# Patient Record
Sex: Male | Born: 1965 | Race: White | Hispanic: No | Marital: Single | Smoking: Former smoker
Health system: Southern US, Community
[De-identification: ages and names within clinical notes are randomized; demographics above are authoritative.]

## PROBLEM LIST (undated history)

## (undated) DIAGNOSIS — N200 Calculus of kidney: Secondary | ICD-10-CM

## (undated) DIAGNOSIS — N529 Male erectile dysfunction, unspecified: Secondary | ICD-10-CM

## (undated) DIAGNOSIS — K219 Gastro-esophageal reflux disease without esophagitis: Secondary | ICD-10-CM

## (undated) HISTORY — PX: WISDOM TOOTH EXTRACTION: SHX21

## (undated) HISTORY — DX: Male erectile dysfunction, unspecified: N52.9

## (undated) HISTORY — DX: Calculus of kidney: N20.0

## (undated) HISTORY — DX: Gastro-esophageal reflux disease without esophagitis: K21.9

---

## 1993-03-02 HISTORY — PX: UROGENITAL SINUS REPAIR: SHX2628

## 2004-09-08 ENCOUNTER — Ambulatory Visit: Payer: Self-pay | Admitting: Internal Medicine

## 2007-03-18 ENCOUNTER — Ambulatory Visit: Payer: Self-pay | Admitting: Internal Medicine

## 2007-03-18 DIAGNOSIS — K219 Gastro-esophageal reflux disease without esophagitis: Secondary | ICD-10-CM | POA: Insufficient documentation

## 2007-03-18 DIAGNOSIS — N529 Male erectile dysfunction, unspecified: Secondary | ICD-10-CM | POA: Insufficient documentation

## 2007-03-21 LAB — CONVERTED CEMR LAB
Cholesterol: 180 mg/dL (ref 0–200)
HDL: 30.2 mg/dL — ABNORMAL LOW (ref >39.0)
Total CHOL/HDL Ratio: 6
Triglycerides: 287 mg/dL (ref 0–149)

## 2008-08-17 ENCOUNTER — Ambulatory Visit: Payer: Self-pay | Admitting: Internal Medicine

## 2008-09-28 ENCOUNTER — Ambulatory Visit: Payer: Self-pay | Admitting: Internal Medicine

## 2008-09-28 DIAGNOSIS — R5381 Other malaise: Secondary | ICD-10-CM | POA: Insufficient documentation

## 2008-09-28 DIAGNOSIS — R5383 Other fatigue: Secondary | ICD-10-CM

## 2008-10-01 LAB — CONVERTED CEMR LAB
AST: 11 units/L (ref 0–37)
Alkaline Phosphatase: 80 units/L (ref 39–117)
BUN: 15 mg/dL (ref 6–23)
Basophils Absolute: 0 10*3/uL (ref 0.0–0.1)
Calcium: 9.8 mg/dL (ref 8.4–10.5)
Creatinine, Ser: 0.96 mg/dL (ref 0.40–1.50)
Eosinophils Absolute: 0.2 10*3/uL (ref 0.0–0.7)
Eosinophils Relative: 2 % (ref 0–5)
Glucose, Bld: 105 mg/dL — ABNORMAL HIGH (ref 70–99)
HCT: 43.9 % (ref 39.0–52.0)
MCHC: 33 g/dL (ref 30.0–36.0)
MCV: 90 fL (ref 78.0–100.0)
Platelets: 246 10*3/uL (ref 150–400)
RDW: 13.4 % (ref 11.5–15.5)

## 2010-01-03 ENCOUNTER — Ambulatory Visit: Payer: Self-pay | Admitting: Internal Medicine

## 2010-04-01 NOTE — Assessment & Plan Note (Signed)
Summary: TROUBLE SWALLOWING FOOD / LFW   Vital Signs:  Patient profile:   45 year old male Weight:      180 pounds BMI:     25.20 Temp:     98.5 degrees F oral BP sitting:   122 / 80  (left arm) Cuff size:   large  Vitals Entered By: Mervin Hack CMA Duncan Dull) (January 03, 2010 9:14 AM) CC: heartburn   History of Present Illness: Havinbg trouble swallowing Feels his food get stuck at the bottom of his food pipe Has to wait for it to pass Long standing but worse now  uses tums regularly--this does help the burn No nocturnal symptoms  No sig regurgitation No cough No throat symptoms or change in voice  Allergies: No Known Drug Allergies  Past History:  Past medical, surgical, family and social histories (including risk factors) reviewed for relevance to current acute and chronic problems.  Past Medical History: Reviewed history from 09/28/2008 and no changes required. GERD Erectile dysfunction  Past Surgical History: Reviewed history from 03/08/2007 and no changes required. Kidney stone 2005 Umbilicus sinus infection - repair  Family History: Reviewed history from 03/08/2007 and no changes required. Father may have  HTN Mother is healthy  One sister No CAD or DM No prostate or colon cancer Asthma (mild) in son  Social History: Reviewed history from 03/08/2007 and no changes required. Marital Status: Single Children: One son, lives with mother Occupation: Public librarian truck- Physicist, medical Former Smoker---quit  ~2002 Alcohol use-occ  Review of Systems       appetite is fine weight stable  bowels are fine  Physical Exam  General:  alert and healthy-appearing.   Neck:  supple, no masses, no thyromegaly, and no cervical lymphadenopathy.   Lungs:  normal respiratory effort, no intercostal retractions, no accessory muscle use, and normal breath sounds.   Heart:  normal rate, regular rhythm, no murmur, and no gallop.   Abdomen:  soft, non-tender, and no  masses.   Extremities:  no edema Psych:  normally interactive, good eye contact, not anxious appearing, and not depressed appearing.     Impression & Recommendations:  Problem # 1:  GERD (ICD-530.81) Assessment Deteriorated  now with dysphagia explained that tums is not helping his problem  P: omeprazole 20mg  daily for at least the next 6 weeks    GI consult if persists    can try off meds if symptoms resolved  His updated medication list for this problem includes:    Omeprazole 20 Mg Tbec (Omeprazole) .Marland Kitchen... 1 tab daily to treat acid. take on an empty stomach  Complete Medication List: 1)  Multivitamins Tabs (Multiple vitamin) .... Take one by mouth daily 2)  Vitamin B-12 1000 Mcg Tabs (Cyanocobalamin) .Marland Kitchen.. 1 daily by mouth 3)  Vitamin C 1000 Mg Tabs (Ascorbic acid) .Marland Kitchen.. 1 daily by mouth 4)  Vardenafil Hcl 10 Mg Tabs (Vardenafil hcl) .Marland Kitchen.. 1 tab about 30-60 minutes before sex as needed 5)  Omeprazole 20 Mg Tbec (Omeprazole) .Marland Kitchen.. 1 tab daily to treat acid. take on an empty stomach  Patient Instructions: 1)  Please take the omeprazole 20mg  daily for at least the next 4-6 weeks. If your symptoms are gone then, you can try to use it only as needed  2)  Please call if your swallowing is not normal within the next month 3)  Please schedule a follow-up appointment in 6-9 months for a physical Prescriptions: VARDENAFIL HCL 10 MG TABS (VARDENAFIL HCL) 1 tab  about 30-60 minutes before sex as needed  #10 x 3   Entered and Authorized by:   Cindee Salt MD   Signed by:   Cindee Salt MD on 01/03/2010   Method used:   Electronically to        [facility] (retail)       [address], [address]       [place], [place]  [address]       Ph: [phone]       Fax: [phone]   RxID:   [id number]    Orders Added: 1)  Est. Patient Level III [42595]   Immunization History:  Influenza Immunization History:    Influenza:   historical (11/30/2009)   Immunization History:  Influenza Immunization History:    Influenza:  Historical (11/30/2009)  Current Allergies (reviewed today): No known allergies

## 2010-08-02 ENCOUNTER — Emergency Department: Payer: Self-pay | Admitting: Unknown Physician Specialty

## 2010-08-05 ENCOUNTER — Encounter: Payer: Self-pay | Admitting: Internal Medicine

## 2010-08-06 ENCOUNTER — Ambulatory Visit: Payer: Self-pay | Admitting: Internal Medicine

## 2010-10-06 ENCOUNTER — Encounter: Payer: Self-pay | Admitting: Internal Medicine

## 2010-10-06 ENCOUNTER — Ambulatory Visit (INDEPENDENT_AMBULATORY_CARE_PROVIDER_SITE_OTHER): Payer: BC Managed Care – PPO | Admitting: Internal Medicine

## 2010-10-06 VITALS — BP 138/90 | HR 72 | Temp 98.5°F | Wt 180.0 lb

## 2010-10-06 DIAGNOSIS — Z0000 Encounter for general adult medical examination without abnormal findings: Secondary | ICD-10-CM

## 2010-10-06 DIAGNOSIS — R5383 Other fatigue: Secondary | ICD-10-CM

## 2010-10-06 DIAGNOSIS — K219 Gastro-esophageal reflux disease without esophagitis: Secondary | ICD-10-CM

## 2010-10-06 DIAGNOSIS — R5381 Other malaise: Secondary | ICD-10-CM

## 2010-10-06 NOTE — Progress Notes (Signed)
Subjective:    Patient ID: Colin Price, male    DOB: 1965-10-11, 45 y.o.   MRN: [id number]  HPI Doing fine No longer having the dysphagia Takes the omeprazole about every other day No sig water brash or regular heartburn  No changes in work or FH  Current Outpatient Prescriptions on File Prior to Visit  Medication Sig Dispense Refill  . Ascorbic Acid (VITAMIN C) 1000 MG tablet Take 1,000 mg by mouth daily.        . Multiple Vitamin (MULTIVITAMIN) capsule Take 1 capsule by mouth daily.        Marland Kitchen omeprazole (PRILOSEC) 20 MG capsule Take 20 mg by mouth daily.        . vitamin B-12 (CYANOCOBALAMIN) 1000 MCG tablet Take 1,000 mcg by mouth daily.        . vardenafil (LEVITRA) 10 MG tablet Take 10 mg by mouth daily as needed.          No Known Allergies  Past Medical History  Diagnosis Date  . GERD (gastroesophageal reflux disease)   . ED (erectile dysfunction)     Past Surgical History  Procedure Date  . Kidney stone surgery 2005  . Urogenital sinus repair     umbilicus sinus infection- repair    Family History  Problem Relation Age of Onset  . Healthy Mother   . Hypertension Father   . Cancer Neg Hx   . Diabetes Neg Hx     History   Social History  . Marital Status: Married    Spouse Name: N/A    Number of Children: 1  . Years of Education: N/A   Occupational History  . drives truck- Primary school teacher    Social History Main Topics  . Smoking status: Former Games developer  . Smokeless tobacco: Never Used  . Alcohol Use: Yes     occasional  . Drug Use: No  . Sexually Active: Not on file   Other Topics Concern  . Not on file   Social History Narrative  . No narrative on file   Review of Systems  Constitutional: Positive for fatigue. Negative for unexpected weight change.       Wears seat belt Occ gets drowsy periods in afternoon--just waits it out and he gets his second wind. He has to get up at 4AM  HENT: Negative for hearing loss, congestion, rhinorrhea,  dental problem and tinnitus.        Regular with dentist  Eyes: Negative for visual disturbance.       No diplopia or focal vision loss  Respiratory: Negative for cough, chest tightness and shortness of breath.   Cardiovascular: Negative for chest pain, palpitations and leg swelling.  Gastrointestinal: Negative for nausea, vomiting, constipation and blood in stool.       Heartburn controlled Rare blood on toilet paper---last was long ago  Genitourinary: Positive for flank pain. Negative for dysuria, frequency and difficulty urinating.       Did have a kidney stone about 2 months ago--seen in urgent care and then ER Not sure if he passed it though but symptoms are gone Hasn't needed meds for ED  Musculoskeletal: Positive for arthralgias. Negative for back pain and joint swelling.       Occ wrist pain AM stiffness in ankles that clears quickly  Skin: Negative for rash.       No suspicious areas but has spot on upper right chest--doesn't bother him  Neurological: Negative for dizziness, syncope, weakness, light-headedness and  headaches.  Hematological: Negative for adenopathy. Does not bruise/bleed easily.  Psychiatric/Behavioral: Negative for sleep disturbance and dysphoric mood. The patient is not nervous/anxious.        Sleeps well but not always enough       Objective:   Physical Exam  Constitutional: He is oriented to person, place, and time. He appears well-developed and well-nourished. No distress.  HENT:  Head: Normocephalic and atraumatic.  Right Ear: External ear normal.  Left Ear: External ear normal.  Mouth/Throat: Oropharynx is clear and moist. No oropharyngeal exudate.       Left TM normal Right obscurred with cerumen  Eyes: Conjunctivae and EOM are normal. Pupils are equal, round, and reactive to light.       Fundi benign  Neck: Normal range of motion. Neck supple. No thyromegaly present.  Cardiovascular: Normal rate, regular rhythm, normal heart sounds and intact  distal pulses.  Exam reveals no gallop.   No murmur heard. Pulmonary/Chest: Effort normal and breath sounds normal. No respiratory distress. He has no wheezes. He has no rales.  Abdominal: Soft. There is no tenderness.  Musculoskeletal: Normal range of motion. He exhibits no edema and no tenderness.  Lymphadenopathy:    He has no cervical adenopathy.  Neurological: He is alert and oriented to person, place, and time. He exhibits normal muscle tone.       Normal strength and gait  Skin: No rash noted.       Small warty growth on right upper chest (2-21mm)  Psychiatric: He has a normal mood and affect. His behavior is normal. Judgment and thought content normal.          Assessment & Plan:

## 2010-10-06 NOTE — Assessment & Plan Note (Signed)
Tired in afternoon at times Doesn't seem to be sleep apnea--may need to get to bed earlier

## 2010-10-06 NOTE — Assessment & Plan Note (Signed)
Better now No dysphagia  On omeprazole about 3-4 days per week

## 2010-10-06 NOTE — Assessment & Plan Note (Signed)
Generally healthy Discussed exercise on days off UTD on tetanus

## 2010-10-07 LAB — BASIC METABOLIC PANEL
BUN: 15 mg/dL (ref 6–23)
Chloride: 107 mEq/L (ref 96–112)
Glucose, Bld: 104 mg/dL — ABNORMAL HIGH (ref 70–99)
Potassium: 4 mEq/L (ref 3.5–5.1)
Sodium: 141 mEq/L (ref 135–145)

## 2010-10-07 LAB — CBC WITH DIFFERENTIAL/PLATELET
Eosinophils Relative: 3 % (ref 0.0–5.0)
HCT: 42.6 % (ref 39.0–52.0)
Hemoglobin: 14.5 g/dL (ref 13.0–17.0)
Lymphs Abs: 2.2 10*3/uL (ref 0.7–4.0)
MCV: 88.9 fl (ref 78.0–100.0)
Monocytes Absolute: 0.7 10*3/uL (ref 0.1–1.0)
Neutro Abs: 5.3 10*3/uL (ref 1.4–7.7)
Platelets: 219 10*3/uL (ref 150.0–400.0)
WBC: 8.5 10*3/uL (ref 4.5–10.5)

## 2010-10-07 LAB — HEPATIC FUNCTION PANEL
ALT: 17 U/L (ref 0–53)
AST: 17 U/L (ref 0–37)
Albumin: 4 g/dL (ref 3.5–5.2)
Alkaline Phosphatase: 80 U/L (ref 39–117)
Total Protein: 7.2 g/dL (ref 6.0–8.3)

## 2010-10-07 LAB — TSH: TSH: 1.07 u[IU]/mL (ref 0.35–5.50)

## 2011-08-04 ENCOUNTER — Emergency Department: Payer: Self-pay | Admitting: Unknown Physician Specialty

## 2011-08-04 LAB — URINALYSIS, COMPLETE
Bacteria: NONE SEEN
Glucose,UR: NEGATIVE mg/dL (ref 0–75)
Ketone: NEGATIVE
Leukocyte Esterase: NEGATIVE
Nitrite: NEGATIVE
Ph: 5 (ref 4.5–8.0)
Protein: 30
RBC,UR: 18 /HPF (ref 0–5)
Squamous Epithelial: NONE SEEN
WBC UR: 4 /HPF (ref 0–5)

## 2011-08-04 LAB — COMPREHENSIVE METABOLIC PANEL
Alkaline Phosphatase: 92 U/L (ref 50–136)
Anion Gap: 9 (ref 7–16)
Bilirubin,Total: 0.4 mg/dL (ref 0.2–1.0)
Calcium, Total: 8.5 mg/dL (ref 8.5–10.1)
Creatinine: 0.99 mg/dL (ref 0.60–1.30)
EGFR (African American): >60
Glucose: 120 mg/dL — ABNORMAL HIGH (ref 65–99)
Potassium: 3.9 mmol/L (ref 3.5–5.1)
SGPT (ALT): 22 U/L

## 2011-08-04 LAB — CBC
HCT: 42.1 % (ref 40.0–52.0)
HGB: 14.4 g/dL (ref 13.0–18.0)
MCHC: 34.1 g/dL (ref 32.0–36.0)
MCV: 89 fL (ref 80–100)
Platelet: 214 10*3/uL (ref 150–440)
RDW: 13.1 % (ref 11.5–14.5)
WBC: 7.8 10*3/uL (ref 3.8–10.6)

## 2011-08-10 ENCOUNTER — Ambulatory Visit: Payer: BC Managed Care – PPO | Admitting: Internal Medicine

## 2011-08-14 ENCOUNTER — Encounter: Payer: Self-pay | Admitting: Internal Medicine

## 2011-08-14 ENCOUNTER — Ambulatory Visit (INDEPENDENT_AMBULATORY_CARE_PROVIDER_SITE_OTHER): Payer: BC Managed Care – PPO | Admitting: Internal Medicine

## 2011-08-14 ENCOUNTER — Ambulatory Visit: Payer: Self-pay | Admitting: Internal Medicine

## 2011-08-14 VITALS — BP 120/80 | HR 76 | Temp 98.2°F | Wt 180.0 lb

## 2011-08-14 DIAGNOSIS — N200 Calculus of kidney: Secondary | ICD-10-CM

## 2011-08-14 NOTE — Assessment & Plan Note (Addendum)
Recurrent but not common Reviewed ER records Had 1 partially obstructing 2mm stone at left UV junction Mild hydronephrosis and hydroureter  Will check ultrasound since he passed stone to be sure hydronephrosis has resolved Stone sent for analysis Info from [employer] printed for him No urology referral likely needed at this point

## 2011-08-14 NOTE — Progress Notes (Signed)
  Subjective:    Patient ID: Colin Price, male    DOB: 1965-03-16, 46 y.o.   MRN: [id number]  HPI Felt kidney stone coming on for a few days No pain at first--just sensation in groin Hit him ~3AM 6/4 Used left over oxycodone  Went to [facility] ER Passed the stone---felt burning and passed 6/11 afternoon (felt it pass) Now pain is gone  Told he has other stones in there  No current outpatient prescriptions on file prior to visit.    No Known Allergies  Past Medical History  Diagnosis Date  . GERD (gastroesophageal reflux disease)   . ED (erectile dysfunction)   . Kidney stones     multiple---has passed    Past Surgical History  Procedure Date  . Urogenital sinus repair     umbilicus sinus infection- repair    Family History  Problem Relation Age of Onset  . Healthy Mother   . Hypertension Father   . Cancer Neg Hx   . Diabetes Neg Hx     History   Social History  . Marital Status: Married    Spouse Name: N/A    Number of Children: 1  . Years of Education: N/A   Occupational History  . drives truck- Primary school teacher    Social History Main Topics  . Smoking status: Former Games developer  . Smokeless tobacco: Never Used  . Alcohol Use: Yes     occasional  . Drug Use: No  . Sexually Active: Not on file   Other Topics Concern  . Not on file   Social History Narrative  . No narrative on file   Review of Systems No fever Eating okay now    Objective:   Physical Exam  Constitutional: He appears well-developed and well-nourished. No distress.  Neck: Normal range of motion.  Abdominal: Soft. Bowel sounds are normal. There is no tenderness. There is no rebound and no guarding.  Musculoskeletal:       No CVA tenderness  Lymphadenopathy:    He has no cervical adenopathy.          Assessment & Plan:

## 2011-08-19 LAB — STONE ANALYSIS

## 2012-08-09 ENCOUNTER — Encounter: Payer: Self-pay | Admitting: Internal Medicine

## 2012-08-09 ENCOUNTER — Ambulatory Visit (INDEPENDENT_AMBULATORY_CARE_PROVIDER_SITE_OTHER): Payer: BC Managed Care – PPO | Admitting: Internal Medicine

## 2012-08-09 VITALS — BP 120/80 | HR 64 | Temp 98.1°F | Ht 70.0 in | Wt 180.0 lb

## 2012-08-09 DIAGNOSIS — K219 Gastro-esophageal reflux disease without esophagitis: Secondary | ICD-10-CM

## 2012-08-09 DIAGNOSIS — Z0000 Encounter for general adult medical examination without abnormal findings: Secondary | ICD-10-CM

## 2012-08-09 LAB — BASIC METABOLIC PANEL
BUN: 16 mg/dL (ref 6–23)
CO2: 24 mEq/L (ref 19–32)
Calcium: 9.2 mg/dL (ref 8.4–10.5)
GFR: 98.57 mL/min (ref >60.00)
Glucose, Bld: 106 mg/dL — ABNORMAL HIGH (ref 70–99)

## 2012-08-09 LAB — CBC WITH DIFFERENTIAL/PLATELET
Basophils Absolute: 0 10*3/uL (ref 0.0–0.1)
Eosinophils Absolute: 0.2 10*3/uL (ref 0.0–0.7)
HCT: 42.3 % (ref 39.0–52.0)
Lymphocytes Relative: 26.4 % (ref 12.0–46.0)
Lymphs Abs: 2 10*3/uL (ref 0.7–4.0)
MCHC: 34.1 g/dL (ref 30.0–36.0)
Monocytes Relative: 8.8 % (ref 3.0–12.0)
Neutro Abs: 4.7 10*3/uL (ref 1.4–7.7)
Platelets: 237 10*3/uL (ref 150.0–400.0)
RDW: 12.9 % (ref 11.5–14.6)

## 2012-08-09 LAB — TSH: TSH: 1.41 u[IU]/mL (ref 0.35–5.50)

## 2012-08-09 LAB — HEPATIC FUNCTION PANEL
Albumin: 3.8 g/dL (ref 3.5–5.2)
Alkaline Phosphatase: 76 U/L (ref 39–117)
Total Protein: 7.3 g/dL (ref 6.0–8.3)

## 2012-08-09 NOTE — Assessment & Plan Note (Signed)
Fine with intermittent Rx

## 2012-08-09 NOTE — Progress Notes (Signed)
Subjective:    Patient ID: Colin Price, male    DOB: 1965/11/28, 47 y.o.   MRN: [id number]  HPI Here for physical No new concerns  No stones since the last time Tries to drink a lot of fluids Will hold off on potassium citrate  Still on omeprazole but only uses prn No swallowing problems  No current outpatient prescriptions on file prior to visit.   No current facility-administered medications on file prior to visit.    No Known Allergies  Past Medical History  Diagnosis Date  . GERD (gastroesophageal reflux disease)   . ED (erectile dysfunction)   . Kidney stones     multiple---has passed    Past Surgical History  Procedure Laterality Date  . Urogenital sinus repair      umbilicus sinus infection- repair    Family History  Problem Relation Age of Onset  . Healthy Mother   . Hypertension Father   . Diabetes Neg Hx   . Heart disease Maternal Grandfather   . Cancer Paternal Grandfather     died of melanoma    History   Social History  . Marital Status: Married    Spouse Name: N/A    Number of Children: 1  . Years of Education: N/A   Occupational History  . drives truck- Primary school teacher    Social History Main Topics  . Smoking status: Former Games developer  . Smokeless tobacco: Never Used  . Alcohol Use: Yes     Comment: occasional  . Drug Use: No  . Sexually Active: Not on file   Other Topics Concern  . Not on file   Social History Narrative  . No narrative on file   Review of Systems  Constitutional: Negative for fatigue and unexpected weight change.       Not much exercise Wears seat belt  HENT: Negative for hearing loss, congestion, rhinorrhea, dental problem and tinnitus.        Regular with dentist  Eyes: Negative for visual disturbance.       No diplopia or unilateral vision loss Uses reading glasses  Respiratory: Negative for cough, chest tightness and shortness of breath.   Cardiovascular: Negative for chest pain, palpitations and  leg swelling.  Gastrointestinal: Negative for nausea, vomiting, abdominal pain, constipation and blood in stool.       Heartburn controlled  Endocrine: Negative for cold intolerance and heat intolerance.  Genitourinary: Negative for urgency, frequency and difficulty urinating.       No problems with sex lately  Musculoskeletal: Positive for arthralgias. Negative for back pain and joint swelling.       Occ left wrist pain--like when golfing. Ibuprofen before he plays will help  Skin: Negative for rash.       No suspicious lesions  Allergic/Immunologic: Negative for environmental allergies and immunocompromised state.  Neurological: Negative for dizziness, syncope, weakness, light-headedness and numbness.       Rare headache behind eyes  Hematological: Positive for adenopathy. Does not bruise/bleed easily.       Can have transient "swollen" area in left neck---transient and not painful  Psychiatric/Behavioral: Negative for sleep disturbance and dysphoric mood. The patient is not nervous/anxious.        Objective:   Physical Exam  Constitutional: He is oriented to person, place, and time. He appears well-developed and well-nourished. No distress.  HENT:  Head: Normocephalic and atraumatic.  Right Ear: External ear normal.  Left Ear: External ear normal.  Mouth/Throat: Oropharynx is clear and  moist. No oropharyngeal exudate.  Eyes: Conjunctivae and EOM are normal. Pupils are equal, round, and reactive to light.  Neck: Normal range of motion. Neck supple. No thyromegaly present.  Cardiovascular: Normal rate, regular rhythm, normal heart sounds and intact distal pulses.  Exam reveals no gallop.   No murmur heard. Pulmonary/Chest: Effort normal and breath sounds normal. No respiratory distress. He has no wheezes. He has no rales.  Abdominal: Soft. There is no tenderness.  Musculoskeletal: He exhibits no edema and no tenderness.  Lymphadenopathy:    He has no cervical adenopathy.   Neurological: He is alert and oriented to person, place, and time.  Skin: No rash noted. No erythema.  Several small seb keratoses  Psychiatric: He has a normal mood and affect. His behavior is normal.          Assessment & Plan:

## 2012-08-09 NOTE — Assessment & Plan Note (Signed)
Generally healthy Discussed exercise Weight stable No cancer screening yet

## 2012-08-11 ENCOUNTER — Encounter: Payer: Self-pay | Admitting: *Deleted

## 2012-08-15 ENCOUNTER — Encounter: Payer: BC Managed Care – PPO | Admitting: Internal Medicine

## 2014-03-14 ENCOUNTER — Telehealth: Payer: Self-pay | Admitting: Internal Medicine

## 2014-03-14 NOTE — Telephone Encounter (Signed)
appt scheduled 2/15 @ 2:15

## 2014-03-14 NOTE — Telephone Encounter (Signed)
Pt needs CPE prior March and wants a Friday or Monday apptmt if possible.  The first thing you have available is not until April 12th. Can you accommodate an apptmt prior to March? Please advise.

## 2014-04-16 ENCOUNTER — Encounter: Payer: Self-pay | Admitting: Internal Medicine

## 2014-04-16 ENCOUNTER — Ambulatory Visit (INDEPENDENT_AMBULATORY_CARE_PROVIDER_SITE_OTHER): Payer: BLUE CROSS/BLUE SHIELD | Admitting: Internal Medicine

## 2014-04-16 VITALS — BP 122/80 | HR 72 | Temp 98.5°F | Ht 70.0 in | Wt 182.5 lb

## 2014-04-16 DIAGNOSIS — Z0000 Encounter for general adult medical examination without abnormal findings: Secondary | ICD-10-CM

## 2014-04-16 DIAGNOSIS — Z23 Encounter for immunization: Secondary | ICD-10-CM

## 2014-04-16 LAB — LIPID PANEL
CHOL/HDL RATIO: 5
Cholesterol: 155 mg/dL (ref 0–200)
HDL: 33.1 mg/dL — AB (ref >39.00)
NonHDL: 121.9
Triglycerides: 324 mg/dL — ABNORMAL HIGH (ref 0.0–149.0)
VLDL: 64.8 mg/dL — ABNORMAL HIGH (ref 0.0–40.0)

## 2014-04-16 LAB — LDL CHOLESTEROL, DIRECT: LDL DIRECT: 74 mg/dL

## 2014-04-16 LAB — GLUCOSE, RANDOM: Glucose, Bld: 99 mg/dL (ref 70–99)

## 2014-04-16 NOTE — Addendum Note (Signed)
Addended by: Carter Kitten on: 04/16/2014 02:41 PM   Modules accepted: Orders

## 2014-04-16 NOTE — Assessment & Plan Note (Addendum)
Healthy Discussed increasing fitness efforts No cancer screening till after age 49 Form for work will be done--- will check glucose and lipid Update Td today

## 2014-04-16 NOTE — Progress Notes (Signed)
Pre visit review using our clinic review tool, if applicable. No additional management support is needed unless otherwise documented below in the visit note. 

## 2014-04-16 NOTE — Progress Notes (Signed)
Subjective:    Patient ID: Colin Price, male    DOB: [birth year], 49 y.o.   MRN: [id number]  HPI Here for physical  No new concerns  Does note occasional nocturia-- once or twice Doesn't bother his sleep Good stream No daytime problems No sexual problems  Not really exercising Up and down in truck all day--stays active Still enjoys golf  Current Outpatient Prescriptions on File Prior to Visit  Medication Sig Dispense Refill  . omeprazole (PRILOSEC) 20 MG capsule Take 20 mg by mouth daily as needed.      No current facility-administered medications on file prior to visit.    No Known Allergies  Past Medical History  Diagnosis Date  . GERD (gastroesophageal reflux disease)   . ED (erectile dysfunction)   . Kidney stones     multiple---has passed    Past Surgical History  Procedure Laterality Date  . Urogenital sinus repair      umbilicus sinus infection- repair    Family History  Problem Relation Age of Onset  . Healthy Mother   . Hypertension Father   . Diabetes Neg Hx   . Heart disease Maternal Grandfather   . Cancer Paternal Grandfather     died of melanoma    History   Social History  . Marital Status: Married    Spouse Name: N/A  . Number of Children: 1  . Years of Education: N/A   Occupational History  . drives truck- Engineer, structural    Social History Main Topics  . Smoking status: Former Research scientist (life sciences)  . Smokeless tobacco: Never Used  . Alcohol Use: 0.0 oz/week    0 Standard drinks or equivalent per week     Comment: occasional  . Drug Use: No  . Sexual Activity: Not on file   Other Topics Concern  . Not on file   Social History Narrative   Review of Systems  Constitutional: Negative for fatigue and unexpected weight change.       Wears seat belt  HENT: Negative for dental problem, hearing loss, tinnitus and trouble swallowing.        Regular with dentist--recent crown  Eyes: Negative for visual disturbance.       No diplopia or  unilateral vision loss  Respiratory: Negative for cough, chest tightness and shortness of breath.   Cardiovascular: Negative for chest pain, palpitations and leg swelling.       Occ twinge in chest--not pain. Lasts for a second or 2---at rest  Gastrointestinal: Negative for nausea, vomiting, abdominal pain, constipation and blood in stool.       Still uses the omeprazole prn and heartburn controlled  Endocrine: Negative for polydipsia and polyuria.  Genitourinary: Negative for urgency and difficulty urinating.       No recent kidney stones  Musculoskeletal: Negative for back pain and joint swelling.       Gets stiffness at times---ibuprofen helps before golf  Skin: Negative for rash.       No suspicious lesions  Allergic/Immunologic: Negative for environmental allergies and immunocompromised state.  Neurological: Negative for dizziness, syncope, weakness, light-headedness, numbness and headaches.  Hematological: Negative for adenopathy. Does not bruise/bleed easily.  Psychiatric/Behavioral: Negative for suicidal ideas and dysphoric mood. The patient is not nervous/anxious.        Objective:   Physical Exam  Constitutional: He is oriented to person, place, and time. He appears well-developed and well-nourished. No distress.  HENT:  Head: Normocephalic and atraumatic.  Right Ear: External  ear normal.  Left Ear: External ear normal.  Mouth/Throat: Oropharynx is clear and moist. No oropharyngeal exudate.  Eyes: Conjunctivae and EOM are normal. Pupils are equal, round, and reactive to light.  Neck: Normal range of motion. Neck supple. No thyromegaly present.  Cardiovascular: Normal rate, regular rhythm, normal heart sounds and intact distal pulses.  Exam reveals no gallop.   No murmur heard. Pulmonary/Chest: Effort normal and breath sounds normal. No respiratory distress. He has no wheezes. He has no rales.  Abdominal: Soft. He exhibits no distension and no mass.  Musculoskeletal: He  exhibits no edema or tenderness.  Lymphadenopathy:    He has no cervical adenopathy.  Neurological: He is alert and oriented to person, place, and time.  Skin: No rash noted. No erythema.  Psychiatric: He has a normal mood and affect. His behavior is normal.          Assessment & Plan:

## 2014-04-17 ENCOUNTER — Encounter: Payer: Self-pay | Admitting: *Deleted

## 2014-06-09 IMAGING — US US RENAL KIDNEY
1 series · 14 of 25 positions shown · non-contrast
Comparison: none

REASON FOR EXAM: kidney stones
COMMENTS:

PROCEDURE:     SALAZAR - SALAZAR KIDNEYS  - August 14, 2011  [DATE]
RESULT:

[Series 1: us renal kidney · 0.30mm/px · 14 of 33 slices shown]
[im 1/33]
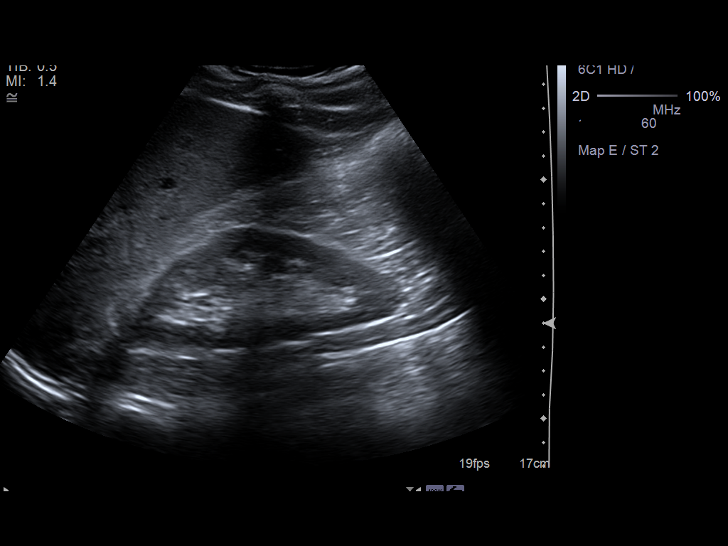
[im 3/33]
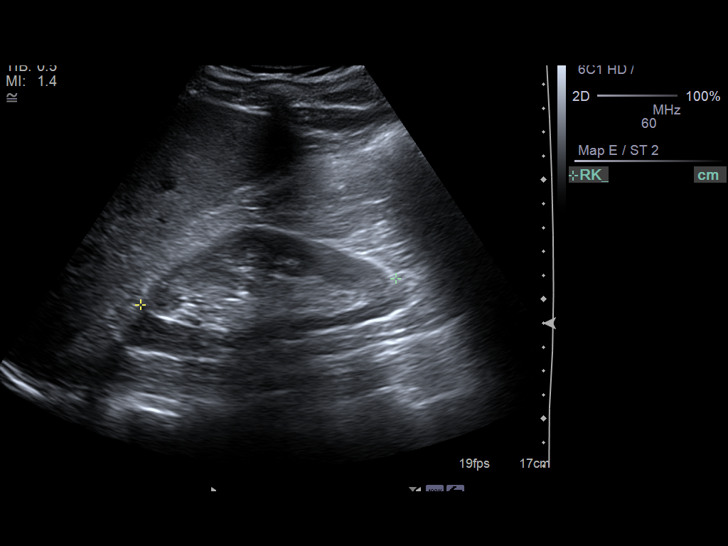
[im 6/33]
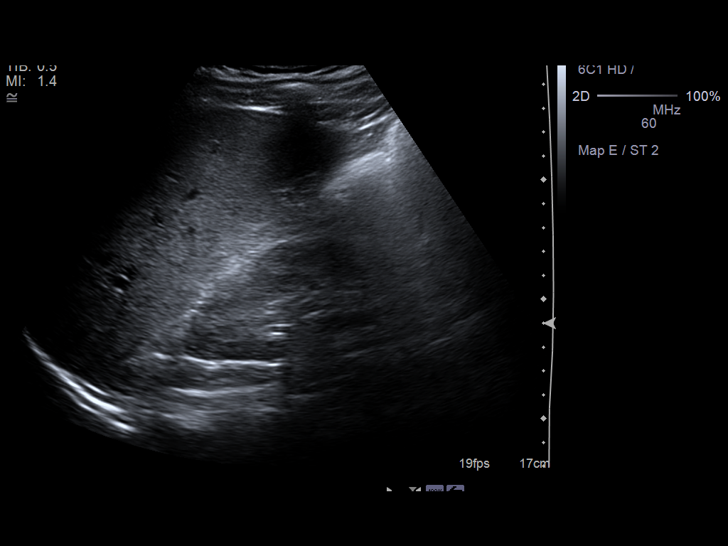
[im 9/33]
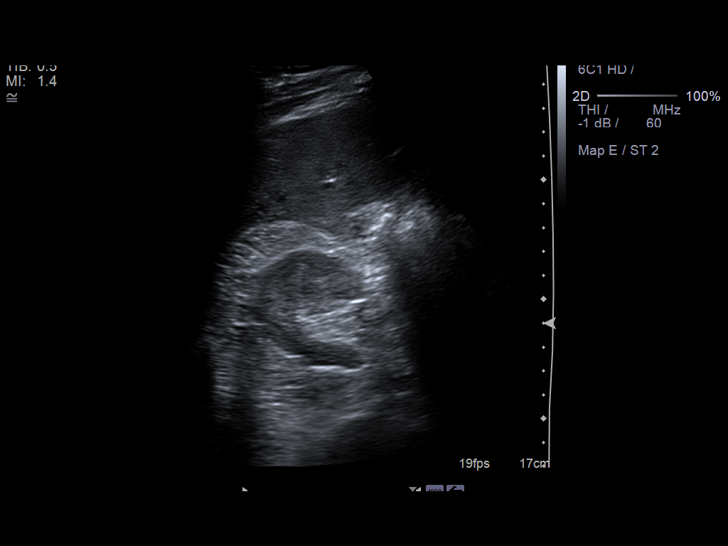
[im 11/33]
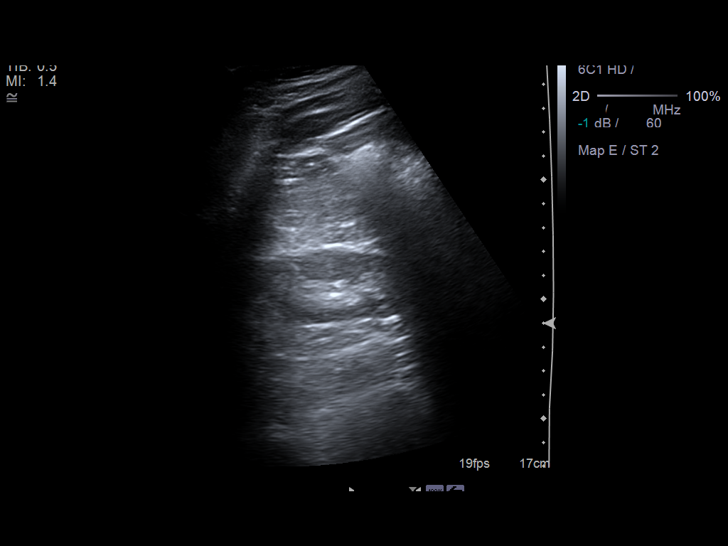
[im 13/33]
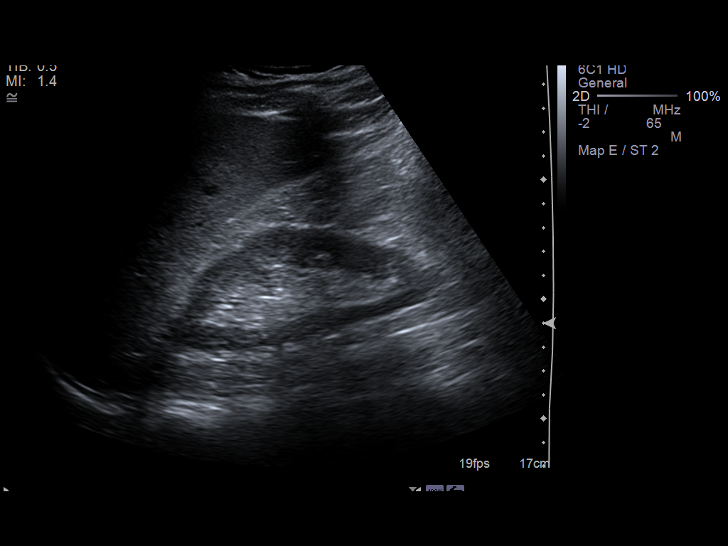
[im 15/33]
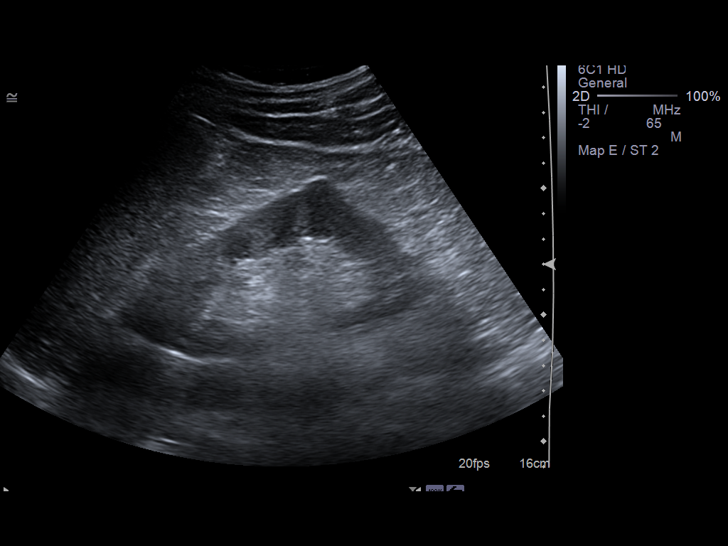
[im 18/33]
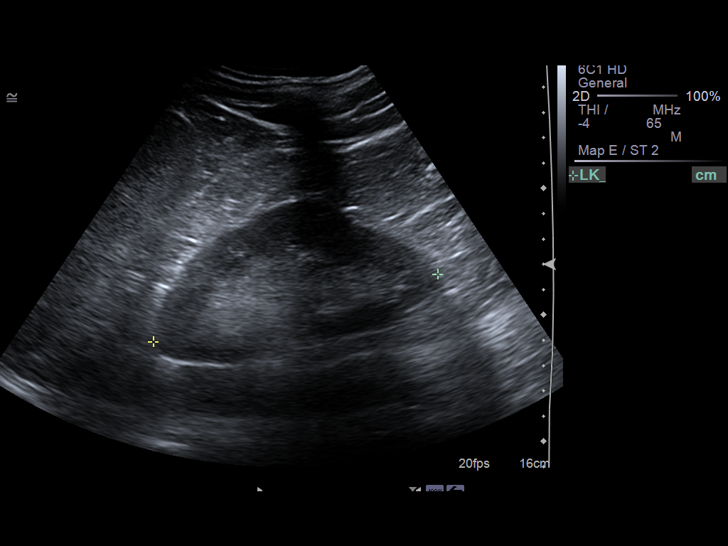
[im 21/33]
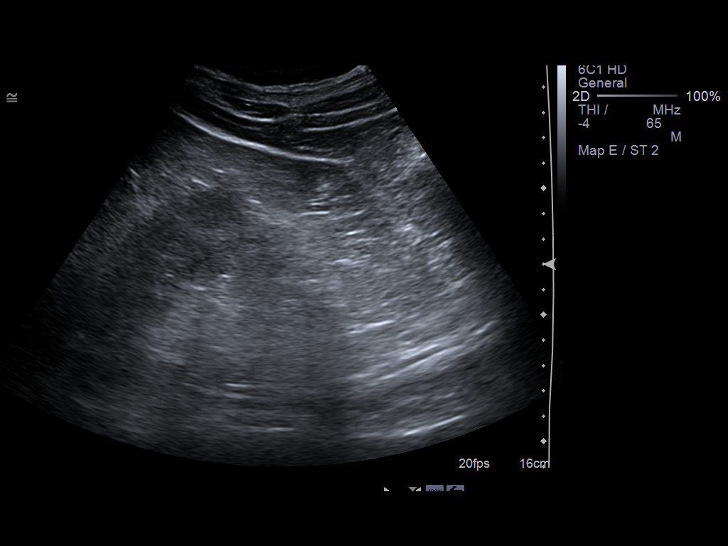
[im 22/33]
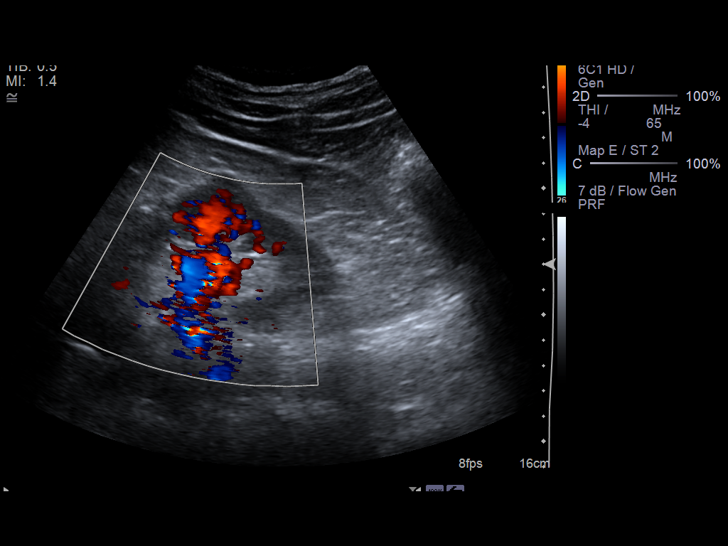
[im 25/33]
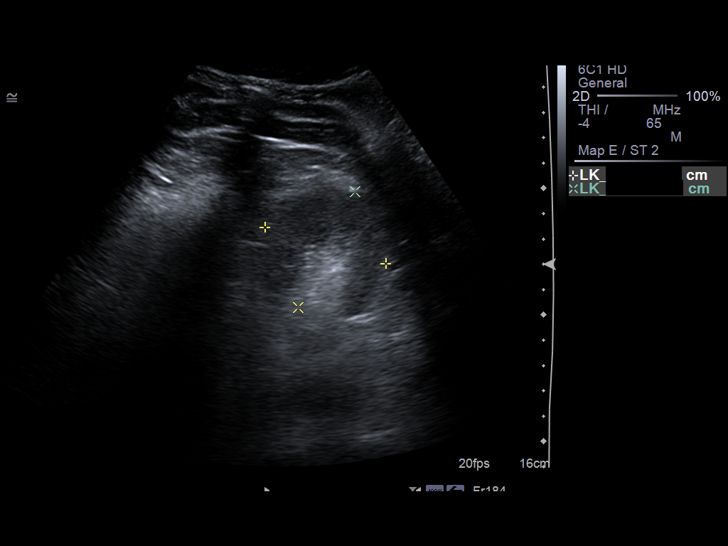
[im 27/33]
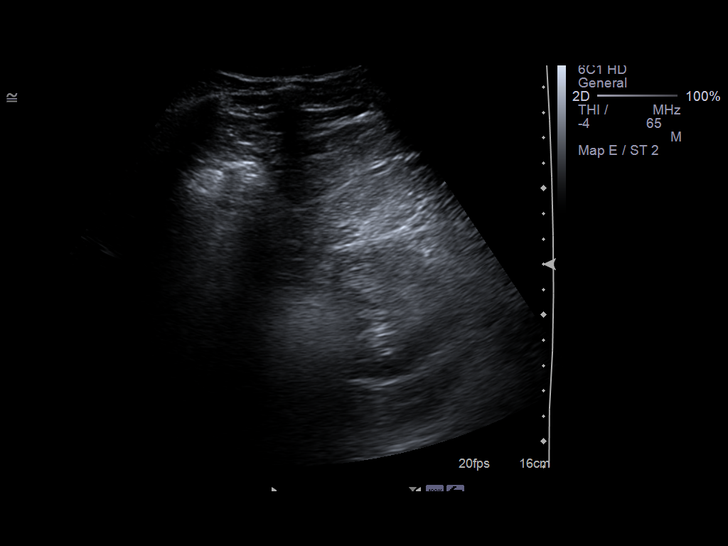
[im 30/33]
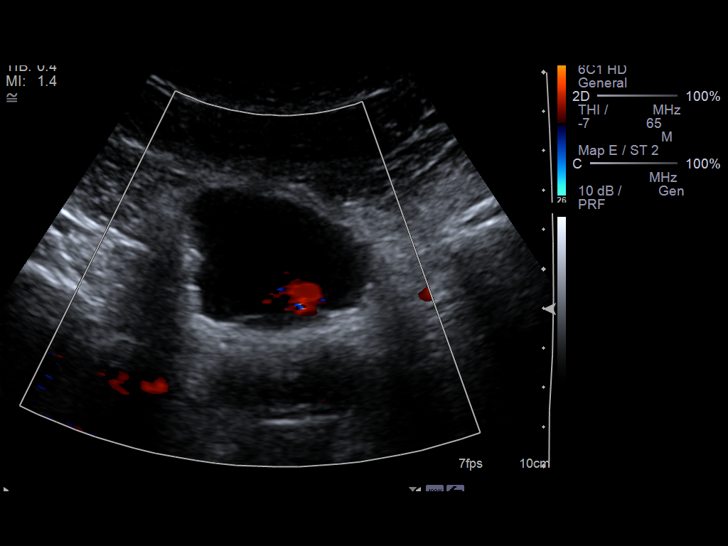
[im 33/33]
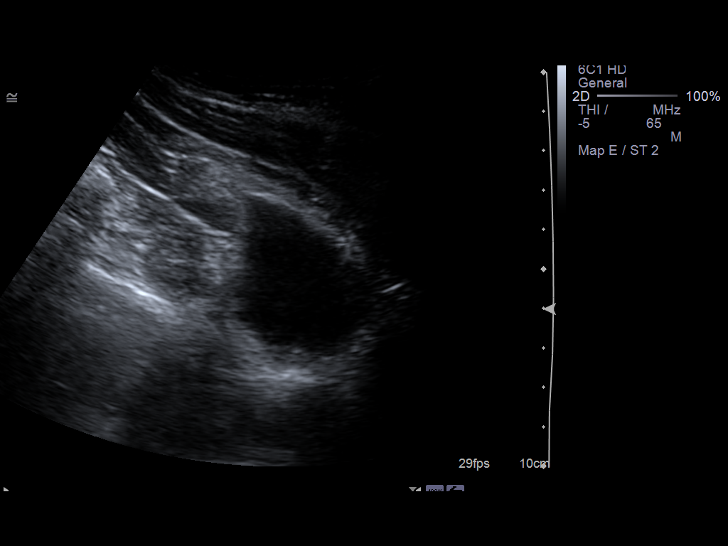

[14 of 25 positions shown; findings below may reference images not displayed]

FINDINGS: The right kidney measures 10.8 x 4.3 x 4.7 cm and the left 11.6 x
5.0 x 5.1 cm. There is appropriate corticomedullary differentiation without
evidence of hydronephrosis, masses or calculi. The urinary bladder is
partially decompressed. A left ureteral jet is identified.
IMPRESSION: Unremarkable renal ultrasound. There is no evidence of
hydronephrosis nor sonographic evidence of renal calculi.

## 2015-03-05 ENCOUNTER — Telehealth: Payer: Self-pay | Admitting: Internal Medicine

## 2015-03-05 NOTE — Telephone Encounter (Signed)
Pt called he has an appointment  07/19/15.  Last cpx 04/16/14  He needs cpx before 05/31/15.  Can i work him in  Please advise

## 2015-03-06 NOTE — Telephone Encounter (Signed)
Per Colin Price he can have any back to back slots that are not same day. There is an opening 2/22 @ 0900. He prefers that they are not scheduled at the first appt of the day

## 2015-03-07 ENCOUNTER — Telehealth: Payer: Self-pay | Admitting: Internal Medicine

## 2015-03-07 NOTE — Telephone Encounter (Signed)
Called to change pt appt - no answer, no message

## 2015-03-13 NOTE — Telephone Encounter (Signed)
Appointment 2/6 pt aware

## 2015-04-08 ENCOUNTER — Encounter: Payer: Self-pay | Admitting: Internal Medicine

## 2015-04-08 ENCOUNTER — Ambulatory Visit (INDEPENDENT_AMBULATORY_CARE_PROVIDER_SITE_OTHER): Payer: 59 | Admitting: Internal Medicine

## 2015-04-08 VITALS — BP 128/88 | HR 89 | Temp 98.1°F | Ht 70.0 in | Wt 184.0 lb

## 2015-04-08 DIAGNOSIS — R7989 Other specified abnormal findings of blood chemistry: Secondary | ICD-10-CM | POA: Diagnosis not present

## 2015-04-08 DIAGNOSIS — Z0000 Encounter for general adult medical examination without abnormal findings: Secondary | ICD-10-CM

## 2015-04-08 DIAGNOSIS — Z1211 Encounter for screening for malignant neoplasm of colon: Secondary | ICD-10-CM | POA: Diagnosis not present

## 2015-04-08 LAB — GLUCOSE, RANDOM: Glucose, Bld: 100 mg/dL — ABNORMAL HIGH (ref 70–99)

## 2015-04-08 LAB — LIPID PANEL
Cholesterol: 175 mg/dL (ref 0–200)
HDL: 34.5 mg/dL — ABNORMAL LOW
NonHDL: 140.4
Total CHOL/HDL Ratio: 5
Triglycerides: 356 mg/dL — ABNORMAL HIGH (ref 0.0–149.0)
VLDL: 71.2 mg/dL — ABNORMAL HIGH (ref 0.0–40.0)

## 2015-04-08 LAB — LDL CHOLESTEROL, DIRECT: Direct LDL: 90 mg/dL

## 2015-04-08 NOTE — Assessment & Plan Note (Signed)
Healthy Discussed fitness Discussed PSA--he will probably get this but will defer Set up colonoscopy

## 2015-04-08 NOTE — Progress Notes (Signed)
Pre visit review using our clinic review tool, if applicable. No additional management support is needed unless otherwise documented below in the visit note. 

## 2015-04-08 NOTE — Progress Notes (Signed)
Subjective:    Patient ID: Colin Price, male    DOB: February 07, 1966, 50 y.o.   MRN: [id number]  HPI Here for physical  Feels well in general No illness  Got promotion so more time in office Plays golf on weekends--but no set exercise Often 11-12 hour days!  Takes the omeprazole 2-3 days a week on average No dysphagia Generally doesn't have symptoms  Current Outpatient Prescriptions on File Prior to Visit  Medication Sig Dispense Refill  . Multiple Vitamin (MULTIVITAMIN) tablet Take 1-2 by mouth three times a week.    Marland Kitchen omeprazole (PRILOSEC) 20 MG capsule Take 20 mg by mouth daily as needed.      No current facility-administered medications on file prior to visit.    No Known Allergies  Past Medical History  Diagnosis Date  . GERD (gastroesophageal reflux disease)   . ED (erectile dysfunction)   . Kidney stones     multiple---has passed    Past Surgical History  Procedure Laterality Date  . Urogenital sinus repair      umbilicus sinus infection- repair    Family History  Problem Relation Age of Onset  . Healthy Mother   . Hypertension Father   . Diabetes Neg Hx   . Heart disease Maternal Grandfather   . Cancer Paternal Grandfather     died of melanoma    Social History   Social History  . Marital Status: Married    Spouse Name: N/A  . Number of Children: 1  . Years of Education: N/A   Occupational History  . drives truck- Engineer, structural    Social History Main Topics  . Smoking status: Former Research scientist (life sciences)  . Smokeless tobacco: Never Used  . Alcohol Use: 0.0 oz/week    0 Standard drinks or equivalent per week     Comment: occasional  . Drug Use: No  . Sexual Activity: Not on file   Other Topics Concern  . Not on file   Social History Narrative   Review of Systems  Constitutional: Negative for fatigue and unexpected weight change.       Wears seat belt  HENT: Positive for tinnitus. Negative for dental problem and hearing loss.        Bp with  dentistrief tinnitus at times Keeps u  Eyes: Negative for visual disturbance.       No diplopia or unilateral vision loss Needs readers now  Respiratory: Negative for cough, chest tightness and shortness of breath.   Cardiovascular: Negative for chest pain, palpitations and leg swelling.  Gastrointestinal: Negative for nausea, vomiting, abdominal pain, constipation and blood in stool.  Endocrine: Negative for polydipsia and polyuria.  Genitourinary: Negative for urgency and difficulty urinating.       Nocturia x 1 usually No sexual problems  Musculoskeletal: Positive for arthralgias. Negative for joint swelling.       Pulled low back ~2 weeks ago--mostly better now. Using salon pas patches  Skin: Negative for rash.       No suspicious lesions  Allergic/Immunologic: Negative for environmental allergies and immunocompromised state.  Neurological: Negative for dizziness, syncope, weakness, light-headedness and headaches.  Hematological: Negative for adenopathy. Does not bruise/bleed easily.  Psychiatric/Behavioral: Negative for sleep disturbance and dysphoric mood. The patient is not nervous/anxious.        Objective:   Physical Exam  Constitutional: He is oriented to person, place, and time. He appears well-developed and well-nourished. No distress.  HENT:  Head: Normocephalic and atraumatic.  Right  Ear: External ear normal.  Left Ear: External ear normal.  Mouth/Throat: Oropharynx is clear and moist. No oropharyngeal exudate.  Eyes: Conjunctivae are normal. Pupils are equal, round, and reactive to light.  Neck: Normal range of motion. Neck supple. No thyromegaly present.  Cardiovascular: Normal rate, regular rhythm, normal heart sounds and intact distal pulses.  Exam reveals no gallop.   No murmur heard. Pulmonary/Chest: Effort normal and breath sounds normal. No respiratory distress. He has no wheezes. He has no rales.  Abdominal: Soft. There is no tenderness.  Musculoskeletal:  He exhibits no edema or tenderness.  Lymphadenopathy:    He has no cervical adenopathy.  Neurological: He is alert and oriented to person, place, and time.  Skin: No rash noted. No erythema.  Psychiatric: He has a normal mood and affect. His behavior is normal.          Assessment & Plan:

## 2015-04-09 ENCOUNTER — Encounter: Payer: Self-pay | Admitting: Gastroenterology

## 2015-04-10 ENCOUNTER — Encounter: Payer: Self-pay | Admitting: *Deleted

## 2015-05-13 ENCOUNTER — Ambulatory Visit (AMBULATORY_SURGERY_CENTER): Payer: Self-pay | Admitting: *Deleted

## 2015-05-13 VITALS — Ht 69.0 in | Wt 185.6 lb

## 2015-05-13 DIAGNOSIS — Z1211 Encounter for screening for malignant neoplasm of colon: Secondary | ICD-10-CM

## 2015-05-13 MED ORDER — SUPREP BOWEL PREP KIT 17.5-3.13-1.6 GM/177ML PO SOLN: 1.0000 | Freq: Once | ORAL | Status: DC

## 2015-05-13 NOTE — Progress Notes (Signed)
Patient denies any allergies to egg or soy products. Patient denies complications with anesthesia/sedation.  Patient denies oxygen use at home and denies diet medications. Emmi instructions for colonoscopy explained but patient denied.     

## 2015-05-27 ENCOUNTER — Encounter: Payer: Self-pay | Admitting: Gastroenterology

## 2015-05-27 ENCOUNTER — Ambulatory Visit (AMBULATORY_SURGERY_CENTER): Payer: 59 | Admitting: Gastroenterology

## 2015-05-27 VITALS — BP 128/87 | HR 78 | Temp 98.4°F | Resp 11 | Ht 69.0 in | Wt 185.0 lb

## 2015-05-27 DIAGNOSIS — D127 Benign neoplasm of rectosigmoid junction: Secondary | ICD-10-CM

## 2015-05-27 DIAGNOSIS — K635 Polyp of colon: Secondary | ICD-10-CM | POA: Diagnosis not present

## 2015-05-27 DIAGNOSIS — Z1211 Encounter for screening for malignant neoplasm of colon: Secondary | ICD-10-CM

## 2015-05-27 MED ORDER — SODIUM CHLORIDE 0.9 % IV SOLN: 500.0000 mL | INTRAVENOUS | Status: DC

## 2015-05-27 NOTE — Progress Notes (Signed)
Called to room to assist during endoscopic procedure.  Patient ID and intended procedure confirmed with present staff. Received instructions for my participation in the procedure from the performing physician.  

## 2015-05-27 NOTE — Op Note (Signed)
[facility] Patient Name: Colin Price Procedure Date: 05/27/2015 7:58 AM MRN: [id number] Endoscopist: Remo Lipps P. Havery Moros , MD Age: 50 Referring MD:  Date of Birth: 1965-05-12 Gender: Male Procedure:                Colonoscopy Indications:              Screening for colorectal malignant neoplasm Medicines:                Monitored Anesthesia Care Procedure:                Pre-Anesthesia Assessment:                           - Prior to the procedure, a History and Physical                            was performed, and patient medications and                            allergies were reviewed. The patient's tolerance of                            previous anesthesia was also reviewed. The risks                            and benefits of the procedure and the sedation                            options and risks were discussed with the patient.                            All questions were answered, and informed consent                            was obtained. Prior Anticoagulants: The patient has                            taken no previous anticoagulant or antiplatelet                            agents. ASA Grade Assessment: II - A patient with                            mild systemic disease. After reviewing the risks                            and benefits, the patient was deemed in                            satisfactory condition to undergo the procedure.                           After obtaining informed consent, the colonoscope  was passed under direct vision. Throughout the                            procedure, the patient's blood pressure, pulse, and                            oxygen saturations were monitored continuously. The                            Model CF-HQ190L [id number]) scope was introduced                            through the anus and advanced to the the cecum,                            identified by appendiceal orifice  and ileocecal                            valve. The colonoscopy was performed without                            difficulty. The patient tolerated the procedure                            well. The quality of the bowel preparation was                            adequate. The ileocecal valve, appendiceal orifice,                            and rectum were photographed. Scope In: 8:00:58 AM Scope Out: 8:17:38 AM Scope Withdrawal Time: 0 hours 14 minutes 0 seconds  Total Procedure Duration: 0 hours 16 minutes 40 seconds  Findings:      The perianal and digital rectal examinations were normal.      Two sessile polyps were found in the recto-sigmoid colon. The polyps       were 3 mm in size. These polyps were removed with a cold biopsy forceps.       Resection and retrieval were complete.      A few small-mouthed diverticula were found in the sigmoid colon.      The exam was otherwise without abnormality on direct and retroflexion       views. Complications:            No immediate complications. Estimated blood loss:                            Minimal. Estimated Blood Loss:     Estimated blood loss was minimal. Impression:               - Two 3 mm polyps at the recto-sigmoid colon,                            removed with a cold biopsy forceps. Resected and  retrieved.                           - Diverticulosis in the sigmoid colon.                           - The examination was otherwise normal on direct                            and retroflexion views. Recommendation:           - Patient has a contact number available for                            emergencies. The signs and symptoms of potential                            delayed complications were discussed with the                            patient. Return to normal activities tomorrow.                            Written discharge instructions were provided to the                            patient.                            - Resume previous diet.                           - Continue present medications.                           - Await pathology results.                           - Repeat colonoscopy is recommended for                            surveillance. The colonoscopy date will be                            determined after pathology results from today's                            exam become available for review. Procedure Code(s):        --- Professional ---                           [phone], Colonoscopy, flexible; with biopsy, single                            or multiple CPT copyright 2016 American Medical Association. All rights reserved. Remo Lipps P. Havery Moros, MD 05/27/2015 8:21:27 AM This report has been signed electronically. Number of Addenda: 0 Referring MD:  Venia Carbon

## 2015-05-27 NOTE — Progress Notes (Signed)
A/ox3 pleased with MAC, report toA/ox3 pleased with MAC, report to Penny RN 

## 2015-05-27 NOTE — Patient Instructions (Signed)
YOU HAD AN ENDOSCOPIC PROCEDURE TODAY AT THE [facility]:   Refer to the procedure report that was given to you for any specific questions about what was found during the examination.  If the procedure report does not answer your questions, please call your gastroenterologist to clarify.  If you requested that your care partner not be given the details of your procedure findings, then the procedure report has been included in a sealed envelope for you to review at your convenience later.  YOU SHOULD EXPECT: Some feelings of bloating in the abdomen. Passage of more gas than usual.  Walking can help get rid of the air that was put into your GI tract during the procedure and reduce the bloating. If you had a lower endoscopy (such as a colonoscopy or flexible sigmoidoscopy) you may notice spotting of blood in your stool or on the toilet paper. If you underwent a bowel prep for your procedure, you may not have a normal bowel movement for a few days.  Please Note:  You might notice some irritation and congestion in your nose or some drainage.  This is from the oxygen used during your procedure.  There is no need for concern and it should clear up in a day or so.  SYMPTOMS TO REPORT IMMEDIATELY:   Following lower endoscopy (colonoscopy or flexible sigmoidoscopy):  Excessive amounts of blood in the stool  Significant tenderness or worsening of abdominal pains  Swelling of the abdomen that is new, acute  Fever of 100F or higher    For urgent or emergent issues, a gastroenterologist can be reached at any hour by calling [phone].   DIET: Your first meal following the procedure should be a small meal and then it is ok to progress to your normal diet. Heavy or fried foods are harder to digest and may make you feel nauseous or bloated.  Likewise, meals heavy in dairy and vegetables can increase bloating.  Drink plenty of fluids but you should avoid alcoholic beverages for 24  hours.  ACTIVITY:  You should plan to take it easy for the rest of today and you should NOT DRIVE or use heavy machinery until tomorrow (because of the sedation medicines used during the test).    FOLLOW UP: Our staff will call the number listed on your records the next business day following your procedure to check on you and address any questions or concerns that you may have regarding the information given to you following your procedure. If we do not reach you, we will leave a message.  However, if you are feeling well and you are not experiencing any problems, there is no need to return our call.  We will assume that you have returned to your regular daily activities without incident.  If any biopsies were taken you will be contacted by phone or by letter within the next 1-3 weeks.  Please call us at [phone] if you have not heard about the biopsies in 3 weeks.    SIGNATURES/CONFIDENTIALITY: You and/or your care partner have signed paperwork which will be entered into your electronic medical record.  These signatures attest to the fact that that the information above on your After Visit Summary has been reviewed and is understood.  Full responsibility of the confidentiality of this discharge information lies with you and/or your care-partner.    Information on polyps & diverticulosis given to you today 

## 2015-05-28 ENCOUNTER — Telehealth: Payer: Self-pay | Admitting: *Deleted

## 2015-05-28 NOTE — Telephone Encounter (Signed)
  Follow up Call-  Call back number 05/27/2015  Post procedure Call Back phone  # [phone]  Permission to leave phone message Yes     Patient questions:  Do you have a fever, pain , or abdominal swelling? No. Pain Score  0 *  Have you tolerated food without any problems? Yes.    Have you been able to return to your normal activities? Yes.    Do you have any questions about your discharge instructions: Diet   No. Medications  No. Follow up visit  No.  Do you have questions or concerns about your Care? No.  Actions: * If pain score is 4 or above: No action needed, pain <4.

## 2015-05-31 ENCOUNTER — Encounter: Payer: Self-pay | Admitting: Gastroenterology

## 2015-07-19 ENCOUNTER — Encounter: Payer: BLUE CROSS/BLUE SHIELD | Admitting: Internal Medicine

## 2015-09-06 NOTE — Telephone Encounter (Signed)
error 

## 2016-01-01 ENCOUNTER — Ambulatory Visit (INDEPENDENT_AMBULATORY_CARE_PROVIDER_SITE_OTHER): Payer: 59 | Admitting: Family Medicine

## 2016-01-01 ENCOUNTER — Encounter: Payer: Self-pay | Admitting: Family Medicine

## 2016-01-01 DIAGNOSIS — N529 Male erectile dysfunction, unspecified: Secondary | ICD-10-CM | POA: Diagnosis not present

## 2016-01-01 MED ORDER — SILDENAFIL CITRATE 20 MG PO TABS: 60.0000 mg | ORAL_TABLET | Freq: Every day | ORAL | 2 refills | Status: DC

## 2016-01-01 NOTE — Progress Notes (Signed)
ED.  "I think I may need a little help."  He had noted changes, gradually getting worse.  "It's not reliable."  No LUTS.  Not on BP meds.  No h/o OSA.  Social situation is stable.  Used meds prev with effect, viagra.  No CP, SOB, BLE edema.    Meds, vitals, and allergies reviewed.   ROS: Per HPI unless specifically indicated in ROS section   GEN: nad, alert and oriented NECK: supple w/o LA CV: rrr.  no murmur PULM: ctab, no inc wob EXT: no edema

## 2016-01-01 NOTE — Patient Instructions (Signed)
Update us as needed.  Take care. Glad to see you.  

## 2016-01-01 NOTE — Progress Notes (Signed)
Pre visit review using our clinic review tool, if applicable. No additional management support is needed unless otherwise documented below in the visit note. 

## 2016-01-02 NOTE — Assessment & Plan Note (Signed)
Reasonable to start sildenafil. Routine cautions given. Prescription sent. Update [place] as needed.

## 2016-04-10 ENCOUNTER — Encounter: Payer: 59 | Admitting: Internal Medicine

## 2016-04-17 ENCOUNTER — Encounter: Payer: Self-pay | Admitting: Internal Medicine

## 2016-04-17 ENCOUNTER — Ambulatory Visit (INDEPENDENT_AMBULATORY_CARE_PROVIDER_SITE_OTHER): Payer: 59 | Admitting: Internal Medicine

## 2016-04-17 VITALS — BP 130/88 | HR 82 | Temp 98.1°F | Ht 70.0 in | Wt 182.0 lb

## 2016-04-17 DIAGNOSIS — Z125 Encounter for screening for malignant neoplasm of prostate: Secondary | ICD-10-CM | POA: Diagnosis not present

## 2016-04-17 DIAGNOSIS — Z0000 Encounter for general adult medical examination without abnormal findings: Secondary | ICD-10-CM

## 2016-04-17 DIAGNOSIS — K219 Gastro-esophageal reflux disease without esophagitis: Secondary | ICD-10-CM

## 2016-04-17 LAB — COMPREHENSIVE METABOLIC PANEL
ALBUMIN: 4.1 g/dL (ref 3.5–5.2)
ALK PHOS: 81 U/L (ref 39–117)
ALT: 21 U/L (ref 0–53)
AST: 14 U/L (ref 0–37)
BUN: 13 mg/dL (ref 6–23)
CHLORIDE: 104 meq/L (ref 96–112)
CO2: 27 mEq/L (ref 19–32)
CREATININE: 0.86 mg/dL (ref 0.40–1.50)
Calcium: 9.5 mg/dL (ref 8.4–10.5)
GFR: 99.69 mL/min (ref >60.00)
GLUCOSE: 92 mg/dL (ref 70–99)
POTASSIUM: 4.2 meq/L (ref 3.5–5.1)
SODIUM: 140 meq/L (ref 135–145)
TOTAL PROTEIN: 6.9 g/dL (ref 6.0–8.3)
Total Bilirubin: 0.5 mg/dL (ref 0.2–1.2)

## 2016-04-17 LAB — CBC WITH DIFFERENTIAL/PLATELET
BASOS PCT: 0.5 % (ref 0.0–3.0)
Basophils Absolute: 0 10*3/uL (ref 0.0–0.1)
EOS ABS: 0.2 10*3/uL (ref 0.0–0.7)
EOS PCT: 2.2 % (ref 0.0–5.0)
HCT: 43.8 % (ref 39.0–52.0)
HEMOGLOBIN: 14.8 g/dL (ref 13.0–17.0)
LYMPHS ABS: 2.4 10*3/uL (ref 0.7–4.0)
Lymphocytes Relative: 25.8 % (ref 12.0–46.0)
MCHC: 33.7 g/dL (ref 30.0–36.0)
MCV: 88.1 fl (ref 78.0–100.0)
MONO ABS: 0.8 10*3/uL (ref 0.1–1.0)
Monocytes Relative: 8.1 % (ref 3.0–12.0)
NEUTROS PCT: 63.4 % (ref 43.0–77.0)
Neutro Abs: 6 10*3/uL (ref 1.4–7.7)
PLATELETS: 267 10*3/uL (ref 150.0–400.0)
RBC: 4.97 Mil/uL (ref 4.22–5.81)
RDW: 13 % (ref 11.5–15.5)
WBC: 9.4 10*3/uL (ref 4.0–10.5)

## 2016-04-17 LAB — LIPID PANEL
CHOLESTEROL: 165 mg/dL (ref 0–200)
HDL: 31.1 mg/dL — ABNORMAL LOW (ref >39.00)
NonHDL: 133.8
Total CHOL/HDL Ratio: 5
Triglycerides: 374 mg/dL — ABNORMAL HIGH (ref 0.0–149.0)
VLDL: 74.8 mg/dL — ABNORMAL HIGH (ref 0.0–40.0)

## 2016-04-17 LAB — PSA: PSA: 0.85 ng/mL (ref 0.10–4.00)

## 2016-04-17 LAB — LDL CHOLESTEROL, DIRECT: LDL DIRECT: 77 mg/dL

## 2016-04-17 NOTE — Progress Notes (Signed)
Pre visit review using our clinic review tool, if applicable. No additional management support is needed unless otherwise documented below in the visit note. 

## 2016-04-17 NOTE — Progress Notes (Signed)
Subjective:    Patient ID: Colin Price, male    DOB: Jun 26, 1965, 51 y.o.   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 ibuprofen (ADVIL,MOTRIN) 200 MG tablet Take 200 mg by mouth every 6 (six) hours as needed.    . Multiple Vitamin (MULTIVITAMIN) tablet Take 1-2 by mouth three times a week.    Marland Kitchen omeprazole (PRILOSEC) 20 MG capsule Take 20 mg by mouth daily as needed.     . sildenafil (REVATIO) 20 MG tablet Take 3-5 tablets (60-100 mg total) by mouth daily. 50 tablet 2   No current facility-administered medications on file prior to visit.     No Known Allergies  Past Medical History:  Diagnosis Date  . ED (erectile dysfunction)   . GERD (gastroesophageal reflux disease)   . Kidney stones    multiple---has passed    Past Surgical History:  Procedure Laterality Date  . UROGENITAL SINUS REPAIR  [id number]   umbilicus sinus infection- repair  . WISDOM TOOTH EXTRACTION      Family History  Problem Relation Age of Onset  . Healthy Mother   . Hypertension Father   . Colon polyps Father   . Heart disease Maternal Grandfather   . Cancer Paternal Grandfather     died of melanoma  . Colon cancer Neg Hx   . Esophageal cancer Neg Hx   . Rectal cancer Neg Hx   . Stomach cancer Neg Hx     Social History   Social History  . Marital status: Married    Spouse name: N/A  . Number of children: 1  . Years of education: N/A   Occupational History  . drives truck- Engineer, structural    Social History Main Topics  . Smoking status: Former Smoker    Packs/day: 0.25    Years: 10.00    Types: Cigarettes    Quit date: 03/02/2000  . Smokeless tobacco: Never Used  . Alcohol use 1.8 oz/week    3 Shots of liquor  per week     Comment: occasional  . Drug use: No  . Sexual activity: Not on file   Other Topics Concern  . Not on file   Social History Narrative  . No narrative on file   Review of Systems  Constitutional: Negative for fatigue and unexpected weight change.       Still not really exercising--except golf Wears seat belt  HENT: Negative for hearing loss and tinnitus.        Recent crown--sees dentist regularly  Eyes: Negative for visual disturbance.       No diplopia or unilateral vision loss  Respiratory: Negative for cough, chest tightness and shortness of breath.   Cardiovascular: Negative for chest pain, palpitations and leg swelling.  Gastrointestinal: Negative for blood in stool and constipation.  Endocrine: Negative for polydipsia and polyuria.  Genitourinary: Negative for difficulty urinating, frequency and urgency.  Musculoskeletal: Negative for arthralgias, back pain and joint swelling.  Skin: Negative for rash.       No suspicious lesions  Allergic/Immunologic: Negative for environmental allergies and immunocompromised state.  Neurological: Positive for dizziness. Negative for syncope, light-headedness and headaches.       Rare, brief dizzy feelings  Hematological: Negative for adenopathy. Does not bruise/bleed easily.  Psychiatric/Behavioral: Negative for dysphoric mood and sleep disturbance. The patient is not nervous/anxious.        Objective:   Physical Exam  Constitutional: He is oriented to person, place, and time. He appears well-developed and well-nourished. No distress.  HENT:  Head: Normocephalic and atraumatic.  Right Ear: External ear normal.  Left Ear: External ear normal.  Mouth/Throat: Oropharynx is clear and moist. No oropharyngeal exudate.  Eyes: Conjunctivae are normal. Pupils are equal, round, and reactive to light.  Neck: Normal range of motion. Neck supple. No thyromegaly present.  Cardiovascular: Normal rate, regular rhythm, normal heart  sounds and intact distal pulses.  Exam reveals no gallop.   No murmur heard. Pulmonary/Chest: Effort normal and breath sounds normal. No respiratory distress. He has no wheezes. He has no rales.  Abdominal: Soft. There is no tenderness.  Musculoskeletal: He exhibits no edema or tenderness.  Lymphadenopathy:    He has no cervical adenopathy.  Neurological: He is alert and oriented to person, place, and time.  Skin: No rash noted. No erythema.  Psychiatric: He has a normal mood and affect. His behavior is normal.          Assessment & Plan:

## 2016-04-17 NOTE — Assessment & Plan Note (Signed)
Does okay with intermittent Rx

## 2016-04-17 NOTE — Assessment & Plan Note (Signed)
Healthy Not really much for fitness or healthy eating Discussed PSA--will check Defer other labs other than glucose Colon due 2027

## 2016-11-24 DIAGNOSIS — Z23 Encounter for immunization: Secondary | ICD-10-CM | POA: Diagnosis not present

## 2017-02-25 ENCOUNTER — Ambulatory Visit (INDEPENDENT_AMBULATORY_CARE_PROVIDER_SITE_OTHER): Payer: 59 | Admitting: Internal Medicine

## 2017-02-25 ENCOUNTER — Encounter: Payer: Self-pay | Admitting: Internal Medicine

## 2017-02-25 VITALS — BP 136/84 | HR 106 | Temp 98.2°F | Wt 188.0 lb

## 2017-02-25 DIAGNOSIS — F39 Unspecified mood [affective] disorder: Secondary | ICD-10-CM | POA: Insufficient documentation

## 2017-02-25 DIAGNOSIS — F419 Anxiety disorder, unspecified: Secondary | ICD-10-CM

## 2017-02-25 MED ORDER — FLUOXETINE HCL 10 MG PO TABS: 10.0000 mg | ORAL_TABLET | Freq: Every day | ORAL | 3 refills | Status: DC

## 2017-02-25 NOTE — Patient Instructions (Addendum)
Please start the fluoxetine 10mg  each morning. Let me know after 3-4 weeks if you don't notice any difference---or if you have any apparent side effects. You need to take your vacation time. Regular exercise will help you with your symptoms.

## 2017-02-25 NOTE — Assessment & Plan Note (Signed)
Has had life long problem with anger, etc Now worse with stressful job Discussed protecting himself by taking his vacation Not medicating with alcohol (rarely uses) Discussed exercise Will try low dose fluoxetine

## 2017-02-25 NOTE — Progress Notes (Signed)
Subjective:    Patient ID: Colin Price, male    DOB: 12-19-1965, 51 y.o.   MRN: [id number]  HPI Here due to increased stress  "It really feels like that... 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--- now working as dispatcher in office, lots of paperwork, drivers calling him with problems, etc Job worse since they merged different product lines  Has to open at 4:15AM some days and close on other weeks-- will work near 12 hours a day either way Is off this week and had a week off this summer Does get 5 weeks vacation a year--but he doesn't take it all May improve when he is away from work but it doesn't totally go away Long standing "nerves"----family has noted ("they encourage me to drink") He doesn't drink regularly but will often have a few on Friday to unwind  Not depressed Enjoys job other than it being too much at times Not anhedonic  Current Outpatient Medications on File Prior to Visit  Medication Sig Dispense Refill  . ibuprofen (ADVIL,MOTRIN) 200 MG tablet Take 200 mg by mouth every 6 (six) hours as needed.    . Multiple Vitamin (MULTIVITAMIN) tablet Take 1-2 by mouth three times a week.    Marland Kitchen omeprazole (PRILOSEC) 20 MG capsule Take 20 mg by mouth daily as needed.     . sildenafil (REVATIO) 20 MG tablet Take 3-5 tablets (60-100 mg total) by mouth daily. 50 tablet 2   No current facility-administered medications on file prior to visit.     No Known Allergies  Past Medical History:  Diagnosis Date  . ED (erectile dysfunction)   . GERD (gastroesophageal reflux disease)   . Kidney stones    multiple---has passed    Past Surgical History:  Procedure Laterality Date  . UROGENITAL SINUS  REPAIR  6237   umbilicus sinus infection- repair  . WISDOM TOOTH EXTRACTION      Family History  Problem Relation Age of Onset  . Healthy Mother   . Hypertension Father   . Colon polyps Father   . Heart disease Maternal Grandfather   . Cancer Paternal Grandfather        died of melanoma  . Colon cancer Neg Hx   . Esophageal cancer Neg Hx   . Rectal cancer Neg Hx   . Stomach cancer Neg Hx     Social History   Socioeconomic History  . Marital status: Single    Spouse name: Not on file  . Number of children: 1  . Years of education: Not on file  . Highest education level: Not on file  Social Needs  . Financial resource strain: Not on file  . Food insecurity - worry: Not on file  . Food insecurity - inability: Not on file  . Transportation needs - medical: Not on file  . Transportation needs - non-medical: Not on file  Occupational History  . Occupation: Counsellor    CommentNetwork engineer  Tobacco Use  . Smoking status: Former Smoker    Packs/day: 0.25    Years: 10.00    Pack years: 2.50    Types: Cigarettes    Last attempt to quit: 03/02/2000    Years  since quitting: 16.9  . Smokeless tobacco: Never Used  Substance and Sexual Activity  . Alcohol use: Yes    Alcohol/week: 1.8 oz    Types: 3 Shots of liquor per week    Comment: occasional  . Drug use: No  . Sexual activity: Not on file  Other Topics Concern  . Not on file  Social History Narrative  . Not on file   Review of Systems  Appetite is good Weight stable Some trouble sleeping--will awaken after only a few hours and trouble reinitiating     Objective:   Physical Exam  Constitutional: He appears well-developed. No distress.  Psychiatric:  Normal appearance and speech Mood seems neutral and affect appropriate No thought process disturbance          Assessment & Plan:

## 2017-03-24 ENCOUNTER — Telehealth: Payer: Self-pay | Admitting: Internal Medicine

## 2017-03-24 MED ORDER — FLUOXETINE HCL 20 MG PO TABS: 20.0000 mg | ORAL_TABLET | Freq: Every day | ORAL | 3 refills | Status: DC

## 2017-03-24 NOTE — Telephone Encounter (Signed)
Copied from [place] [id number]. Topic: Quick Communication - See Telephone Encounter >> Mar 24, 2017  8:54 AM Cleaster Corin, NT wrote: CRM for notification. See Telephone encounter for:   03/24/17. Pt. Calling to see if he can get a refill on Prozac and also have the mg up to 20mg . Pt. Can be reached at [phone]   [id number] [place], [address] [address] Phone: [phone] Fax: [phone]

## 2017-03-24 NOTE — Telephone Encounter (Signed)
Spoke to pt. New rx for 20mg  sent to the pharmacy.

## 2017-03-24 NOTE — Telephone Encounter (Signed)
Okay to change to 20mg   #90 x 3 Has follow up next month

## 2017-04-23 ENCOUNTER — Encounter: Payer: Self-pay | Admitting: Internal Medicine

## 2017-04-23 ENCOUNTER — Ambulatory Visit (INDEPENDENT_AMBULATORY_CARE_PROVIDER_SITE_OTHER): Payer: 59 | Admitting: Internal Medicine

## 2017-04-23 VITALS — BP 128/88 | HR 79 | Temp 98.2°F | Ht 69.75 in | Wt 183.0 lb

## 2017-04-23 DIAGNOSIS — Z0000 Encounter for general adult medical examination without abnormal findings: Secondary | ICD-10-CM | POA: Diagnosis not present

## 2017-04-23 DIAGNOSIS — K219 Gastro-esophageal reflux disease without esophagitis: Secondary | ICD-10-CM | POA: Diagnosis not present

## 2017-04-23 DIAGNOSIS — F419 Anxiety disorder, unspecified: Secondary | ICD-10-CM

## 2017-04-23 LAB — LIPID PANEL
CHOL/HDL RATIO: 5
Cholesterol: 163 mg/dL (ref 0–200)
HDL: 30.7 mg/dL — ABNORMAL LOW (ref >39.00)
NONHDL: 132.43
TRIGLYCERIDES: 266 mg/dL — AB (ref 0.0–149.0)
VLDL: 53.2 mg/dL — ABNORMAL HIGH (ref 0.0–40.0)

## 2017-04-23 LAB — COMPREHENSIVE METABOLIC PANEL
ALT: 17 U/L (ref 0–53)
AST: 15 U/L (ref 0–37)
Albumin: 3.8 g/dL (ref 3.5–5.2)
Alkaline Phosphatase: 82 U/L (ref 39–117)
BUN: 16 mg/dL (ref 6–23)
CALCIUM: 9.3 mg/dL (ref 8.4–10.5)
CHLORIDE: 105 meq/L (ref 96–112)
CO2: 27 meq/L (ref 19–32)
CREATININE: 0.82 mg/dL (ref 0.40–1.50)
GFR: 104.9 mL/min (ref >60.00)
Glucose, Bld: 94 mg/dL (ref 70–99)
Potassium: 4.2 mEq/L (ref 3.5–5.1)
SODIUM: 140 meq/L (ref 135–145)
Total Bilirubin: 0.6 mg/dL (ref 0.2–1.2)
Total Protein: 7 g/dL (ref 6.0–8.3)

## 2017-04-23 LAB — CBC
HCT: 41.7 % (ref 39.0–52.0)
HEMOGLOBIN: 14.2 g/dL (ref 13.0–17.0)
MCHC: 34.1 g/dL (ref 30.0–36.0)
MCV: 87.9 fl (ref 78.0–100.0)
PLATELETS: 279 10*3/uL (ref 150.0–400.0)
RBC: 4.74 Mil/uL (ref 4.22–5.81)
RDW: 13 % (ref 11.5–15.5)
WBC: 8.4 10*3/uL (ref 4.0–10.5)

## 2017-04-23 LAB — LDL CHOLESTEROL, DIRECT: Direct LDL: 81 mg/dL

## 2017-04-23 NOTE — Assessment & Plan Note (Signed)
Generally healthy Will defer PSA till 30 Colon due 2027 Discussed fitness

## 2017-04-23 NOTE — Assessment & Plan Note (Signed)
Still work stress Will continue the fluoxetine

## 2017-04-23 NOTE — Progress Notes (Signed)
Subjective:    Patient ID: Colin Price, male    DOB: 29-Oct-1965, 52 y.o.   MRN: [id number]  HPI Here for physical  Still with stress and anxiety "Things have been going good" Is some better ---- will continue the medication for now  Uses the omeprazole intermittently  No heartburn or dysphagia with this (but can't go more than 2 days without) Discussed using every other day regularly  Current Outpatient Medications on File Prior to Visit  Medication Sig Dispense Refill  . FLUoxetine (PROZAC) 20 MG tablet Take 1 tablet (20 mg total) by mouth daily. 90 tablet 3  . ibuprofen (ADVIL,MOTRIN) 200 MG tablet Take 200 mg by mouth every 6 (six) hours as needed.    . Multiple Vitamin (MULTIVITAMIN) tablet Take 1-2 by mouth three times a week.    Marland Kitchen omeprazole (PRILOSEC) 20 MG capsule Take 20 mg by mouth daily as needed.     . sildenafil (REVATIO) 20 MG tablet Take 3-5 tablets (60-100 mg total) by mouth daily. 50 tablet 2   No current facility-administered medications on file prior to visit.     No Known Allergies  Past Medical History:  Diagnosis Date  . ED (erectile dysfunction)   . GERD (gastroesophageal reflux disease)   . Kidney stones    multiple---has passed    Past Surgical History:  Procedure Laterality Date  . UROGENITAL SINUS REPAIR  4098   umbilicus sinus infection- repair  . WISDOM TOOTH EXTRACTION      Family History  Problem Relation Age of Onset  . Healthy Mother   . Hypertension Father   . Colon polyps Father   . Heart disease Maternal Grandfather   . Cancer Paternal Grandfather        died of melanoma  . Colon cancer Neg Hx   . Esophageal cancer Neg Hx   . Rectal cancer Neg Hx   . Stomach cancer Neg Hx     Social History   Socioeconomic History  . Marital status: Single    Spouse name: Not on file  . Number of children: 1  . Years of education: Not on file  . Highest education level: Not on file  Social Needs  . Financial resource strain:  Not on file  . Food insecurity - worry: Not on file  . Food insecurity - inability: Not on file  . Transportation needs - medical: Not on file  . Transportation needs - non-medical: Not on file  Occupational History  . Occupation: Counsellor    CommentNetwork engineer  Tobacco Use  . Smoking status: Former Smoker    Packs/day: 0.25    Years: 10.00    Pack years: 2.50    Types: Cigarettes    Last attempt to quit: 03/02/2000    Years since quitting: 17.1  . Smokeless tobacco: Never Used  Substance and Sexual Activity  . Alcohol use: Yes    Alcohol/week: 1.8 oz    Types: 3 Shots of liquor per week    Comment: occasional  . Drug use: No  . Sexual activity: Not on file  Other Topics Concern  . Not on file  Social History Narrative  . Not on file   Review of Systems  Constitutional: Negative for fatigue and unexpected weight change.       Wears seat belt   HENT: Negative for dental problem, hearing loss, tinnitus and trouble swallowing.        Keeps up with dentist  Eyes:  Negative for visual disturbance.       No diplopia or unilateral vision loss  Respiratory: Negative for shortness of breath.        Cough with recent respiratory infection--one right after another  Gastrointestinal: Negative for blood in stool and constipation.  Endocrine: Negative for polydipsia and polyuria.  Genitourinary: Negative for difficulty urinating and urgency.       Discussed safe sex  Musculoskeletal: Negative for back pain.       Some joint stiffness after sitting  Skin: Negative for rash.       No suspicious lesions  Allergic/Immunologic: Negative for environmental allergies and immunocompromised state.  Neurological: Negative for dizziness, syncope, light-headedness and headaches.  Hematological: Negative for adenopathy. Does not bruise/bleed easily.  Psychiatric/Behavioral: Negative for dysphoric mood. The patient is nervous/anxious.        Some sleep problems having to get up at 3AM  many days       Objective:   Physical Exam  Constitutional: He is oriented to person, place, and time. He appears well-developed and well-nourished. No distress.  HENT:  Head: Normocephalic and atraumatic.  Right Ear: External ear normal.  Left Ear: External ear normal.  Mouth/Throat: Oropharynx is clear and moist. No oropharyngeal exudate.  Eyes: Conjunctivae are normal. Pupils are equal, round, and reactive to light.  Neck: Normal range of motion. No thyromegaly present.  Cardiovascular: Normal rate, regular rhythm, normal heart sounds and intact distal pulses. Exam reveals no gallop.  No murmur heard. Pulmonary/Chest: Effort normal and breath sounds normal. No respiratory distress. He has no wheezes. He has no rales.  Abdominal: Soft. He exhibits no distension. There is no tenderness. There is no rebound and no guarding.  Musculoskeletal: He exhibits no edema or tenderness.  Lymphadenopathy:    He has no cervical adenopathy.  Neurological: He is alert and oriented to person, place, and time.  Skin: No rash noted. No erythema.  Psychiatric: He has a normal mood and affect. His behavior is normal.          Assessment & Plan:

## 2017-04-23 NOTE — Assessment & Plan Note (Signed)
Asked him to take it every other day to prevent symptoms

## 2018-01-13 ENCOUNTER — Encounter: Payer: Self-pay | Admitting: Emergency Medicine

## 2018-01-13 ENCOUNTER — Emergency Department
Admission: EM | Admit: 2018-01-13 | Discharge: 2018-01-13 | Disposition: A | Discharge status: Home / Self Care | Payer: 59 | Attending: Emergency Medicine | Admitting: Emergency Medicine

## 2018-01-13 ENCOUNTER — Other Ambulatory Visit: Payer: Self-pay

## 2018-01-13 ENCOUNTER — Emergency Department: Payer: 59

## 2018-01-13 DIAGNOSIS — R1030 Lower abdominal pain, unspecified: Secondary | ICD-10-CM | POA: Diagnosis present

## 2018-01-13 DIAGNOSIS — Z87891 Personal history of nicotine dependence: Secondary | ICD-10-CM | POA: Insufficient documentation

## 2018-01-13 DIAGNOSIS — N200 Calculus of kidney: Secondary | ICD-10-CM | POA: Diagnosis not present

## 2018-01-13 DIAGNOSIS — Z79899 Other long term (current) drug therapy: Secondary | ICD-10-CM | POA: Insufficient documentation

## 2018-01-13 DIAGNOSIS — N132 Hydronephrosis with renal and ureteral calculous obstruction: Secondary | ICD-10-CM | POA: Diagnosis not present

## 2018-01-13 LAB — COMPREHENSIVE METABOLIC PANEL
ALBUMIN: 4.3 g/dL (ref 3.5–5.0)
ALT: 24 U/L (ref 0–44)
AST: 21 U/L (ref 15–41)
Alkaline Phosphatase: 94 U/L (ref 38–126)
Anion gap: 8 (ref 5–15)
BUN: 16 mg/dL (ref 6–20)
CHLORIDE: 107 mmol/L (ref 98–111)
CO2: 26 mmol/L (ref 22–32)
Calcium: 9.1 mg/dL (ref 8.9–10.3)
Creatinine, Ser: 0.95 mg/dL (ref 0.61–1.24)
GFR calc Af Amer: >60 mL/min (ref >60)
GFR calc non Af Amer: >60 mL/min (ref >60)
GLUCOSE: 117 mg/dL — AB (ref 70–99)
POTASSIUM: 4.1 mmol/L (ref 3.5–5.1)
Sodium: 141 mmol/L (ref 135–145)
Total Bilirubin: 1 mg/dL (ref 0.3–1.2)
Total Protein: 8 g/dL (ref 6.5–8.1)

## 2018-01-13 LAB — URINALYSIS, COMPLETE (UACMP) WITH MICROSCOPIC
Bacteria, UA: NONE SEEN
Bilirubin Urine: NEGATIVE
GLUCOSE, UA: NEGATIVE mg/dL
Hgb urine dipstick: NEGATIVE
KETONES UR: NEGATIVE mg/dL
Leukocytes, UA: NEGATIVE
Nitrite: NEGATIVE
Protein, ur: 30 mg/dL — AB
SQUAMOUS EPITHELIAL / LPF: NONE SEEN (ref 0–5)
Specific Gravity, Urine: 1.019 (ref 1.005–1.030)
pH: 5 (ref 5.0–8.0)

## 2018-01-13 LAB — CBC WITH DIFFERENTIAL/PLATELET
ABS IMMATURE GRANULOCYTES: 0.02 10*3/uL (ref 0.00–0.07)
BASOS PCT: 1 %
Basophils Absolute: 0.1 10*3/uL (ref 0.0–0.1)
EOS ABS: 0.2 10*3/uL (ref 0.0–0.5)
Eosinophils Relative: 2 %
HCT: 46.9 % (ref 39.0–52.0)
Hemoglobin: 15.6 g/dL (ref 13.0–17.0)
Immature Granulocytes: 0 %
Lymphocytes Relative: 22 %
Lymphs Abs: 1.8 10*3/uL (ref 0.7–4.0)
MCH: 29.7 pg (ref 26.0–34.0)
MCHC: 33.3 g/dL (ref 30.0–36.0)
MCV: 89.3 fL (ref 80.0–100.0)
MONO ABS: 0.7 10*3/uL (ref 0.1–1.0)
MONOS PCT: 9 %
NEUTROS ABS: 5.3 10*3/uL (ref 1.7–7.7)
Neutrophils Relative %: 66 %
Platelets: 296 10*3/uL (ref 150–400)
RBC: 5.25 MIL/uL (ref 4.22–5.81)
RDW: 12.6 % (ref 11.5–15.5)
WBC: 8.1 10*3/uL (ref 4.0–10.5)
nRBC: 0 % (ref 0.0–0.2)

## 2018-01-13 MED ORDER — TAMSULOSIN HCL 0.4 MG PO CAPS: 0.4000 mg | ORAL_CAPSULE | Freq: Every day | ORAL | 0 refills | Status: DC

## 2018-01-13 MED ORDER — MORPHINE SULFATE (PF) 4 MG/ML IV SOLN
4.0000 mg | Freq: Once | INTRAVENOUS | Status: AC
  Administered 2018-01-13: 4 mg via INTRAVENOUS
  Filled 2018-01-13: qty 1

## 2018-01-13 MED ORDER — KETOROLAC TROMETHAMINE 30 MG/ML IJ SOLN
30.0000 mg | Freq: Once | INTRAMUSCULAR | Status: AC
  Administered 2018-01-13: 30 mg via INTRAVENOUS
  Filled 2018-01-13: qty 1

## 2018-01-13 MED ORDER — SODIUM CHLORIDE 0.9 % IV BOLUS
1000.0000 mL | Freq: Once | INTRAVENOUS | Status: AC
  Administered 2018-01-13: 1000 mL via INTRAVENOUS

## 2018-01-13 MED ORDER — OXYCODONE-ACETAMINOPHEN 5-325 MG PO TABS: 1.0000 | ORAL_TABLET | ORAL | 0 refills | Status: DC | PRN

## 2018-01-13 MED ORDER — ONDANSETRON 4 MG PO TBDP: 4.0000 mg | ORAL_TABLET | Freq: Three times a day (TID) | ORAL | 0 refills | Status: DC | PRN

## 2018-01-13 MED ORDER — ONDANSETRON HCL 4 MG/2ML IJ SOLN
4.0000 mg | Freq: Once | INTRAMUSCULAR | Status: AC
  Administered 2018-01-13: 4 mg via INTRAVENOUS
  Filled 2018-01-13: qty 2

## 2018-01-13 MED ORDER — HYDROMORPHONE HCL 1 MG/ML IJ SOLN
1.0000 mg | Freq: Once | INTRAMUSCULAR | Status: AC
  Administered 2018-01-13: 1 mg via INTRAVENOUS
  Filled 2018-01-13: qty 1

## 2018-01-13 NOTE — ED Provider Notes (Signed)
[facility] Emergency Department Provider Note _______________   First MD Initiated Contact with Patient 01/13/18 [phone]     (approximate)  I have reviewed the triage vital signs and the nursing notes.   HISTORY  Chief Complaint Groin Pain    HPI Colin Price is a 51 y.o. male with below list of chronic medical conditions including kidney stone presents to the emergency department with 10 out of 10 acute onset of groin pain which began before arrival to the emergency department this morning.  Patient admits to nausea however no vomiting.  Patient denies any fever afebrile on presentation with a temperature 98.1.  Denies any diarrhea or constipation.   Past Medical History:  Diagnosis Date  . ED (erectile dysfunction)   . GERD (gastroesophageal reflux disease)   . Kidney stones    multiple---has passed    Patient Active Problem List   Diagnosis Date Noted  . Anxiety disorder 02/25/2017  . Kidney stones   . Routine general medical examination at a health care facility 10/06/2010  . GERD 03/18/2007  . Erectile dysfunction 03/18/2007    Past Surgical History:  Procedure Laterality Date  . UROGENITAL SINUS REPAIR  8921   umbilicus sinus infection- repair  . WISDOM TOOTH EXTRACTION      Prior to Admission medications   Medication Sig Start Date End Date Taking? Authorizing Provider  FLUoxetine (PROZAC) 20 MG tablet Take 1 tablet (20 mg total) by mouth daily. 03/24/17   Venia Carbon, MD  ibuprofen (ADVIL,MOTRIN) 200 MG tablet Take 200 mg by mouth every 6 (six) hours as needed.    [provider]  Multiple Vitamin (MULTIVITAMIN) tablet Take 1-2 by mouth three times a week.    [provider]  omeprazole (PRILOSEC) 20 MG capsule Take 20 mg by mouth every other day.    [provider]  sildenafil (REVATIO) 20 MG tablet Take 3-5 tablets (60-100 mg total) by mouth daily. 01/01/16   Tonia Ghent, MD     Allergies No known drug allergies  Family History  Problem Relation Age of Onset  . Healthy Mother   . Hypertension Father   . Colon polyps Father   . Heart disease Maternal Grandfather   . Cancer Paternal Grandfather        died of melanoma  . Colon cancer Neg Hx   . Esophageal cancer Neg Hx   . Rectal cancer Neg Hx   . Stomach cancer Neg Hx     Social History Social History   Tobacco Use  . Smoking status: Former Smoker    Packs/day: 0.25    Years: 10.00    Pack years: 2.50    Types: Cigarettes    Last attempt to quit: 03/02/2000    Years since quitting: 17.8  . Smokeless tobacco: Never Used  Substance Use Topics  . Alcohol use: Yes    Alcohol/week: 3.0 standard drinks    Types: 3 Shots of liquor per week    Comment: occasional  . Drug use: No    Review of Systems Constitutional: No fever/chills Eyes: No visual changes. ENT: No sore throat. Cardiovascular: Denies chest pain. Respiratory: Denies shortness of breath. Gastrointestinal: Positive for groin pain.  No nausea, no vomiting.  No diarrhea.  No constipation. Genitourinary: Negative for dysuria. Musculoskeletal: Negative for neck pain.  Negative for back pain. Integumentary: Negative for rash. Neurological: Negative for headaches, focal weakness or numbness.   ____________________________________________   PHYSICAL EXAM:  VITAL SIGNS: ED Triage Vitals  Enc Vitals Group     BP 01/13/18 0533 (!) 199/100     Pulse Rate 01/13/18 0533 (!) 104     Resp 01/13/18 0533 18     Temp 01/13/18 0533 98.1 F (36.7 C)     Temp Source 01/13/18 0533 Oral     SpO2 01/13/18 0533 99 %     Weight 01/13/18 0532 79.4 kg (175 lb)     Height 01/13/18 0532 1.753 m (5\' 9" )     Head Circumference --      Peak Flow --      Pain Score 01/13/18 0531 8     Pain Loc --      Pain Edu? --      Excl. in [place]? --     Constitutional: Alert and oriented.  Apparent discomfort Eyes: Conjunctivae are normal.  Head:  Atraumatic. Mouth/Throat: Mucous membranes are moist.  Oropharynx non-erythematous. Neck: No stridor.   Cardiovascular: Normal rate, regular rhythm. Good peripheral circulation. Grossly normal heart sounds. Respiratory: Normal respiratory effort.  No retractions. Lungs CTAB. Gastrointestinal: Soft and nontender. No distention.  Musculoskeletal: No lower extremity tenderness nor edema. No gross deformities of extremities. Neurologic:  Normal speech and language. No gross focal neurologic deficits are appreciated.  Skin:  Skin is warm, dry and intact. No rash noted. Psychiatric: Mood and affect are normal. Speech and behavior are normal.  ____________________________________________   LABS (all labs ordered are listed, but only abnormal results are displayed)  Labs Reviewed  COMPREHENSIVE METABOLIC PANEL - Abnormal; Notable for the following components:      Result Value   Glucose, Bld 117 (*)    All other components within normal limits  URINALYSIS, COMPLETE (UACMP) WITH MICROSCOPIC - Abnormal; Notable for the following components:   Color, Urine YELLOW (*)    APPearance CLEAR (*)    Protein, ur 30 (*)    All other components within normal limits  CBC WITH DIFFERENTIAL/PLATELET   ______________________________________  RADIOLOGY I, [place] N Zuriyah Shatz, personally viewed and evaluated these images (plain radiographs) as part of my medical decision making, as well as reviewing the written report by the radiologist.  ED MD interpretation: Mild right-sided hydronephrosis with an obstructing 3 x 2 mm stone located distally at the right UVJ.  Nonobstructing 2 mm stone noted at the interpole region of the right kidney radiologist.  Official radiology report(s): Ct Renal Stone Study  Result Date: 01/13/2018 CLINICAL DATA:  Acute onset of right flank pain. EXAM: CT ABDOMEN AND PELVIS WITHOUT CONTRAST TECHNIQUE: Multidetector CT imaging of the abdomen and pelvis was performed following the  standard protocol without IV contrast. COMPARISON:  CT of the abdomen and pelvis performed 08/04/2011 FINDINGS: Lower chest: The visualized lung bases are grossly clear. The visualized portions of the mediastinum are unremarkable. Hepatobiliary: The liver is unremarkable in appearance. The gallbladder is unremarkable in appearance. The common bile duct remains normal in caliber. Pancreas: The pancreas is within normal limits. Spleen: The spleen is unremarkable in appearance. Adrenals/Urinary Tract: The adrenal glands are unremarkable in appearance. There is mild right-sided hydronephrosis, with prominence of the right ureter along its entire course. An obstructing 3 x 2 mm stone is noted distally at the right vesicoureteral junction. A nonobstructing 2 mm stone is noted at the interpole region of the right kidney. Nonspecific perinephric stranding is noted bilaterally. Stomach/Bowel: The stomach is unremarkable in appearance. The small bowel is within normal limits. The appendix is normal in  caliber, without evidence of appendicitis. There is suggestion of mild wall thickening at the distal sigmoid colon, which could reflect a mild infectious or inflammatory process. Scattered diverticulosis is noted along the proximal sigmoid colon, without evidence of diverticulitis. Vascular/Lymphatic: The abdominal aorta is unremarkable in appearance. The inferior vena cava is grossly unremarkable. No retroperitoneal lymphadenopathy is seen. No pelvic sidewall lymphadenopathy is identified. Reproductive: The bladder is decompressed and not well characterized. The prostate remains normal in size. Other: No additional soft tissue abnormalities are seen. Musculoskeletal: No acute osseous abnormalities are identified. The visualized musculature is unremarkable in appearance. IMPRESSION: 1. Mild right-sided hydronephrosis, with an obstructing 3 x 2 mm stone noted distally at the right vesicoureteral junction. 2. Nonobstructing 2 mm  stone at the interpole region of the right kidney. 3. Suggestion of mild wall thickening at the distal sigmoid colon, which could reflect a mild infectious or inflammatory process. 4. Scattered diverticulosis along the proximal sigmoid colon, without evidence of diverticulitis. Electronically Signed   By: Garald Balding M.D.   On: 01/13/2018 06:13    ____________________________________________    Procedures   ____________________________________________   INITIAL IMPRESSION / ASSESSMENT AND PLAN / ED COURSE  As part of my medical decision making, I reviewed the following data within the electronic MEDICAL RECORD NUMBER  52 year old male presenting with above-stated history and physical exam concerning for ureterolithiasis.  As such CT renal protocol performed which revealed evidence of a 3 x 2 mm stone at the right UVJ.  Patient given IV morphine with minimal improvement of pain in the such patient given 1 mg IV Dilaudid and 30 mg of IV Toradol. ____________________________________________  FINAL CLINICAL IMPRESSION(S) / ED DIAGNOSES  Final diagnoses:  Right kidney stone     MEDICATIONS GIVEN DURING THIS VISIT:  Medications  morphine 4 MG/ML injection 4 mg (4 mg Intravenous Given 01/13/18 0556)  ondansetron (ZOFRAN) injection 4 mg (4 mg Intravenous Given 01/13/18 0557)  sodium chloride 0.9 % bolus 1,000 mL (1,000 mLs Intravenous New Bag/Given 01/13/18 0557)  HYDROmorphone (DILAUDID) injection 1 mg (1 mg Intravenous Given 01/13/18 0626)  ketorolac (TORADOL) 30 MG/ML injection 30 mg (30 mg Intravenous Given 01/13/18 8101)     ED Discharge Orders    None       Note:  This document was prepared using Dragon voice recognition software and may include unintentional dictation errors.    Gregor Hams, MD 01/14/18 [phone]

## 2018-01-13 NOTE — ED Notes (Signed)
Pt to ct at this time.

## 2018-01-13 NOTE — ED Triage Notes (Signed)
Patient ambulatory to triage with steady gait, without difficulty or distress noted; pt reports onset groin pain this morning; st hx of same with kidney stones

## 2018-01-13 NOTE — ED Notes (Signed)
Pt again states he does not have a ride. Pt drove himself here and it has not yet been 4 hours since narcotic administration. Woke pt up for reassessment, pt remains drowsy. WCTM

## 2018-03-25 ENCOUNTER — Other Ambulatory Visit: Payer: Self-pay | Admitting: Internal Medicine

## 2018-04-19 ENCOUNTER — Other Ambulatory Visit: Payer: Self-pay | Admitting: Internal Medicine

## 2018-04-29 ENCOUNTER — Encounter: Payer: 59 | Admitting: Internal Medicine

## 2018-05-13 ENCOUNTER — Ambulatory Visit (INDEPENDENT_AMBULATORY_CARE_PROVIDER_SITE_OTHER): Payer: BLUE CROSS/BLUE SHIELD | Admitting: Internal Medicine

## 2018-05-13 ENCOUNTER — Other Ambulatory Visit: Payer: Self-pay

## 2018-05-13 ENCOUNTER — Encounter: Payer: Self-pay | Admitting: Internal Medicine

## 2018-05-13 DIAGNOSIS — K219 Gastro-esophageal reflux disease without esophagitis: Secondary | ICD-10-CM

## 2018-05-13 DIAGNOSIS — Z0000 Encounter for general adult medical examination without abnormal findings: Secondary | ICD-10-CM | POA: Diagnosis not present

## 2018-05-13 DIAGNOSIS — F419 Anxiety disorder, unspecified: Secondary | ICD-10-CM

## 2018-05-13 MED ORDER — FLUOXETINE HCL 20 MG PO CAPS: 20.0000 mg | ORAL_CAPSULE | Freq: Every day | ORAL | 3 refills | Status: DC

## 2018-05-13 NOTE — Patient Instructions (Addendum)
Look into a high citrate diet (sugar free lemonade, etc) Please increase the fluoxetine to 2 tabs on even days, and 1 on odd days. If that isn't enough to settle things down, go up to 2 tabs every day and let me know.

## 2018-05-13 NOTE — Assessment & Plan Note (Signed)
Worse with stress at work Will increase the fluoxetine to 20/40----and then to 40mg  daily if needed

## 2018-05-13 NOTE — Progress Notes (Signed)
Subjective:    Patient ID: Colin Price, male    DOB: [birth year], 53 y.o.   MRN: [id number]  HPI Here for physical  Kidney stone in November Seen in ER---given oxycodone, antiemetics Thinks he eventually passed it Did get tamsulosin  Anxiety continues to be an issue "I don't know that 20mg  is doing the job" No depression Gets angry ---mostly at work. Wants to try higher dose  Heartburn not a big issue Takes the PPI every other day or so No dysphagia  Current Outpatient Medications on File Prior to Visit  Medication Sig Dispense Refill  . FLUoxetine (PROZAC) 20 MG capsule TAKE 1 CAPSULE BY MOUTH EVERY DAY. INS PREFERS CAPS 90 capsule 0  . ibuprofen (ADVIL,MOTRIN) 200 MG tablet Take 200 mg by mouth every 6 (six) hours as needed.    . Multiple Vitamin (MULTIVITAMIN) tablet Take 1-2 by mouth three times a week.    Marland Kitchen omeprazole (PRILOSEC) 20 MG capsule Take 20 mg by mouth every other day.     No current facility-administered medications on file prior to visit.     No Known Allergies  Past Medical History:  Diagnosis Date  . ED (erectile dysfunction)   . GERD (gastroesophageal reflux disease)   . Kidney stones    multiple---has passed    Past Surgical History:  Procedure Laterality Date  . UROGENITAL SINUS REPAIR  0076   umbilicus sinus infection- repair  . WISDOM TOOTH EXTRACTION      Family History  Problem Relation Age of Onset  . Healthy Mother   . Hypertension Father   . Colon polyps Father   . Heart disease Maternal Grandfather   . Cancer Paternal Grandfather        died of melanoma  . Colon cancer Neg Hx   . Esophageal cancer Neg Hx   . Rectal cancer Neg Hx   . Stomach cancer Neg Hx     Social History   Socioeconomic History  . Marital status: Single    Spouse name: Not on file  . Number of children: 1  . Years of education: Not on file  . Highest education level: Not on file  Occupational History  . Occupation: Counsellor    CommentBarista  . Financial resource strain: Not on file  . Food insecurity:    Worry: Not on file    Inability: Not on file  . Transportation needs:    Medical: Not on file    Non-medical: Not on file  Tobacco Use  . Smoking status: Former Smoker    Packs/day: 0.25    Years: 10.00    Pack years: 2.50    Types: Cigarettes    Last attempt to quit: 03/02/2000    Years since quitting: 18.2  . Smokeless tobacco: Never Used  Substance and Sexual Activity  . Alcohol use: Yes    Alcohol/week: 3.0 standard drinks    Types: 3 Shots of liquor per week    Comment: occasional  . Drug use: No  . Sexual activity: Not on file  Lifestyle  . Physical activity:    Days per week: Not on file    Minutes per session: Not on file  . Stress: Not on file  Relationships  . Social connections:    Talks on phone: Not on file    Gets together: Not on file    Attends religious service: Not on file    Active member of club or  organization: Not on file    Attends meetings of clubs or organizations: Not on file    Relationship status: Not on file  . Intimate partner violence:    Fear of current or ex partner: Not on file    Emotionally abused: Not on file    Physically abused: Not on file    Forced sexual activity: Not on file  Other Topics Concern  . Not on file  Social History Narrative  . Not on file   Review of Systems  Constitutional: Negative for fatigue.       Weight is up some---discussed Plays golf--no other exercise Wears seat belt  HENT: Negative for dental problem, hearing loss and trouble swallowing.        Rare tinnitus  Keeps up with dentist  Eyes: Negative for visual disturbance.       No diplopia or unilateral vision loss   Respiratory: Negative for cough, chest tightness and shortness of breath.   Cardiovascular: Negative for chest pain, palpitations and leg swelling.  Gastrointestinal: Negative for abdominal pain, blood in stool and constipation.   Endocrine: Negative for polydipsia and polyuria.  Genitourinary: Negative for urgency.       Stream is slower No sexual problems  Musculoskeletal: Negative for arthralgias, back pain and joint swelling.  Skin: Negative for rash.       Has mole on left chest---wonders about getting it off  Allergic/Immunologic: Negative for environmental allergies and immunocompromised state.  Neurological: Negative for dizziness, syncope, light-headedness and headaches.  Hematological: Negative for adenopathy. Does not bruise/bleed easily.  Psychiatric/Behavioral: Negative for dysphoric mood and sleep disturbance. The patient is nervous/anxious.        Objective:   Physical Exam  Constitutional: He is oriented to person, place, and time. He appears well-developed. No distress.  HENT:  Head: Normocephalic and atraumatic.  Right Ear: External ear normal.  Left Ear: External ear normal.  Mouth/Throat: Oropharynx is clear and moist. No oropharyngeal exudate.  Eyes: Pupils are equal, round, and reactive to light. Conjunctivae are normal.  Neck: No thyromegaly present.  Cardiovascular: Normal rate, regular rhythm, normal heart sounds and intact distal pulses. Exam reveals no gallop.  No murmur heard. Respiratory: Effort normal and breath sounds normal. No respiratory distress. He has no wheezes. He has no rales.  GI: Soft. There is no abdominal tenderness.  Musculoskeletal:        General: No tenderness or edema.  Lymphadenopathy:    He has no cervical adenopathy.  Neurological: He is alert and oriented to person, place, and time.  Skin: No rash noted. No erythema.  Psychiatric: He has a normal mood and affect. His behavior is normal.           Assessment & Plan:

## 2018-05-13 NOTE — Assessment & Plan Note (Signed)
Healthy but needs to work on fitness Defer PSA to 69 Colon due 2027 Needs yearly flu vaccine--discussed

## 2018-05-13 NOTE — Assessment & Plan Note (Signed)
Controlled on every other day PPI

## 2018-06-27 ENCOUNTER — Encounter: Payer: Self-pay | Admitting: Internal Medicine

## 2018-06-27 ENCOUNTER — Ambulatory Visit (INDEPENDENT_AMBULATORY_CARE_PROVIDER_SITE_OTHER): Payer: BLUE CROSS/BLUE SHIELD | Admitting: Internal Medicine

## 2018-06-27 ENCOUNTER — Other Ambulatory Visit: Payer: Self-pay

## 2018-06-27 DIAGNOSIS — F39 Unspecified mood [affective] disorder: Secondary | ICD-10-CM | POA: Diagnosis not present

## 2018-06-27 MED ORDER — ARIPIPRAZOLE 5 MG PO TABS: 5.0000 mg | ORAL_TABLET | Freq: Every day | ORAL | 3 refills | Status: DC

## 2018-06-27 MED ORDER — FLUOXETINE HCL 20 MG PO CAPS: 20.0000 mg | ORAL_CAPSULE | Freq: Every day | ORAL | 3 refills | Status: DC

## 2018-06-27 NOTE — Patient Instructions (Signed)
Please cut back on the fluoxetine to just 1 daily (20mg ) Let me know if you have any problems with the new medication (aripiprazole).

## 2018-06-27 NOTE — Progress Notes (Signed)
Subjective:    Patient ID: Colin Price, male    DOB: 1965-03-27, 53 y.o.   MRN: [id number]  HPI Virtual visit to discuss his treatment with the fluoxetine Identification done Reviewed billing and consent given He is in his home and I am in my office  He has had a couple of incidences at work Not sure that the medication is working His supervisor has similar issues and is on a mood stabilizer He did try the doubling the floxetine 3 days per week "I'm afraid it is going to get me in trouble"  He does not have mania He just snaps at people over minor issues He is alone at home--so no problems there "I feel it coming and then it takes over control" Two episodes in the past month Generally doesn't have depression  Current Outpatient Medications on File Prior to Visit  Medication Sig Dispense Refill  . FLUoxetine (PROZAC) 20 MG capsule Take 1-2 capsules (20-40 mg total) by mouth daily. 180 capsule 3  . ibuprofen (ADVIL,MOTRIN) 200 MG tablet Take 200 mg by mouth every 6 (six) hours as needed.    . Multiple Vitamin (MULTIVITAMIN) tablet Take 1-2 by mouth three times a week.    Marland Kitchen omeprazole (PRILOSEC) 20 MG capsule Take 20 mg by mouth every other day.     No current facility-administered medications on file prior to visit.     No Known Allergies  Past Medical History:  Diagnosis Date  . ED (erectile dysfunction)   . GERD (gastroesophageal reflux disease)   . Kidney stones    multiple---has passed    Past Surgical History:  Procedure Laterality Date  . UROGENITAL SINUS REPAIR  3329   umbilicus sinus infection- repair  . WISDOM TOOTH EXTRACTION      Family History  Problem Relation Age of Onset  . Healthy Mother   . Hypertension Father   . Colon polyps Father   . Heart disease Maternal Grandfather   . Cancer Paternal Grandfather        died of melanoma  . Colon cancer Neg Hx   . Esophageal cancer Neg Hx   . Rectal cancer Neg Hx   . Stomach cancer Neg Hx     Social History   Socioeconomic History  . Marital status: Single    Spouse name: Not on file  . Number of children: 1  . Years of education: Not on file  . Highest education level: Not on file  Occupational History  . Occupation: Counsellor    CommentCorporate treasurer  . Financial resource strain: Not on file  . Food insecurity:    Worry: Not on file    Inability: Not on file  . Transportation needs:    Medical: Not on file    Non-medical: Not on file  Tobacco Use  . Smoking status: Former Smoker    Packs/day: 0.25    Years: 10.00    Pack years: 2.50    Types: Cigarettes    Last attempt to quit: 03/02/2000    Years since quitting: 18.3  . Smokeless tobacco: Never Used  Substance and Sexual Activity  . Alcohol use: Yes    Alcohol/week: 3.0 standard drinks    Types: 3 Shots of liquor per week    Comment: occasional  . Drug use: No  . Sexual activity: Not on file  Lifestyle  . Physical activity:    Days per week: Not on file    Minutes  per session: Not on file  . Stress: Not on file  Relationships  . Social connections:    Talks on phone: Not on file    Gets together: Not on file    Attends religious service: Not on file    Active member of club or organization: Not on file    Attends meetings of clubs or organizations: Not on file    Relationship status: Not on file  . Intimate partner violence:    Fear of current or ex partner: Not on file    Emotionally abused: Not on file    Physically abused: Not on file    Forced sexual activity: Not on file  Other Topics Concern  . Not on file  Social History Narrative  . Not on file   Review of Systems Sleeps okay Some weird dreams Eating fine Weight is stable    Objective:   Physical Exam  Constitutional: He appears well-developed. No distress.  Respiratory: Effort normal. No respiratory distress.  Psychiatric: He has a normal mood and affect. His behavior is normal.           Assessment &  Plan:

## 2018-06-27 NOTE — Assessment & Plan Note (Signed)
Seems to have anger, anxiety but irritability Could be consistent with atypical bipolar and his supervisor (who has similar issues) felt he could benefit from a mood stabilizer Discussed alternatives (antipsychotic vs seizure medication). Better chance of response with aripiprazole--so will try low dose Decrease fluoxetine to 20 daily and consider weaning off if doing well

## 2018-07-19 ENCOUNTER — Other Ambulatory Visit: Payer: Self-pay | Admitting: Internal Medicine

## 2018-07-29 ENCOUNTER — Ambulatory Visit (INDEPENDENT_AMBULATORY_CARE_PROVIDER_SITE_OTHER): Payer: BLUE CROSS/BLUE SHIELD | Admitting: Internal Medicine

## 2018-07-29 ENCOUNTER — Encounter: Payer: Self-pay | Admitting: Internal Medicine

## 2018-07-29 DIAGNOSIS — F39 Unspecified mood [affective] disorder: Secondary | ICD-10-CM

## 2018-07-29 MED ORDER — ARIPIPRAZOLE 10 MG PO TABS: 10.0000 mg | ORAL_TABLET | Freq: Every day | ORAL | 3 refills | Status: DC

## 2018-07-29 NOTE — Assessment & Plan Note (Signed)
Still with the irritability Did have another "blow up" episode at work Will try increasing aripiprazole to 10 at bedtime--but go back to 5 if that makes him sleepy again Recheck 1 month---consider weaning prozac then if better

## 2018-07-29 NOTE — Progress Notes (Signed)
Subjective:    Patient ID: Colin Price, male    DOB: 05-11-1965, 53 y.o.   MRN: [id number]  HPI Virtual visit to review mood status with new Rx Identification done Reviewed billing and he gave consent He is in his car, and I am at my office  Has had to take the medication in the evening Would make him sleepy---so taking at night Sleeping pretty well now and not really tired in the day now  He did have 1 spell "where I went off" Not sure if it is helping overall--still has some degree of irritability Still on the prozac in the morning Not feeling depressed  Current Outpatient Medications on File Prior to Visit  Medication Sig Dispense Refill  . ARIPiprazole (ABILIFY) 5 MG tablet TAKE 1 TABLET BY MOUTH EVERY DAY 90 tablet 0  . FLUoxetine (PROZAC) 20 MG capsule Take 1 capsule (20 mg total) by mouth daily. 180 capsule 3  . ibuprofen (ADVIL,MOTRIN) 200 MG tablet Take 200 mg by mouth every 6 (six) hours as needed.    . Multiple Vitamin (MULTIVITAMIN) tablet Take 1-2 by mouth three times a week.    Marland Kitchen omeprazole (PRILOSEC) 20 MG capsule Take 20 mg by mouth every other day.     No current facility-administered medications on file prior to visit.     No Known Allergies  Past Medical History:  Diagnosis Date  . ED (erectile dysfunction)   . GERD (gastroesophageal reflux disease)   . Kidney stones    multiple---has passed    Past Surgical History:  Procedure Laterality Date  . UROGENITAL SINUS REPAIR  1829   umbilicus sinus infection- repair  . WISDOM TOOTH EXTRACTION      Family History  Problem Relation Age of Onset  . Healthy Mother   . Hypertension Father   . Colon polyps Father   . Heart disease Maternal Grandfather   . Cancer Paternal Grandfather        died of melanoma  . Colon cancer Neg Hx   . Esophageal cancer Neg Hx   . Rectal cancer Neg Hx   . Stomach cancer Neg Hx     Social History   Socioeconomic History  . Marital status: Single    Spouse  name: Not on file  . Number of children: 1  . Years of education: Not on file  . Highest education level: Not on file  Occupational History  . Occupation: Counsellor    CommentCorporate treasurer  . Financial resource strain: Not on file  . Food insecurity:    Worry: Not on file    Inability: Not on file  . Transportation needs:    Medical: Not on file    Non-medical: Not on file  Tobacco Use  . Smoking status: Former Smoker    Packs/day: 0.25    Years: 10.00    Pack years: 2.50    Types: Cigarettes    Last attempt to quit: 03/02/2000    Years since quitting: 18.4  . Smokeless tobacco: Never Used  Substance and Sexual Activity  . Alcohol use: Yes    Alcohol/week: 3.0 standard drinks    Types: 3 Shots of liquor per week    Comment: occasional  . Drug use: No  . Sexual activity: Not on file  Lifestyle  . Physical activity:    Days per week: Not on file    Minutes per session: Not on file  . Stress: Not on file  Relationships  . Social connections:    Talks on phone: Not on file    Gets together: Not on file    Attends religious service: Not on file    Active member of club or organization: Not on file    Attends meetings of clubs or organizations: Not on file    Relationship status: Not on file  . Intimate partner violence:    Fear of current or ex partner: Not on file    Emotionally abused: Not on file    Physically abused: Not on file    Forced sexual activity: Not on file  Other Topics Concern  . Not on file  Social History Narrative  . Not on file   Review of Systems No change in appetite Weight is stable    Objective:   Physical Exam  Constitutional: He appears well-developed. No distress.  Respiratory: Effort normal. No respiratory distress.  Psychiatric: He has a normal mood and affect. His behavior is normal.           Assessment & Plan:

## 2018-08-26 ENCOUNTER — Ambulatory Visit: Payer: BC Managed Care – PPO | Admitting: Internal Medicine

## 2018-08-28 ENCOUNTER — Other Ambulatory Visit: Payer: Self-pay | Admitting: Internal Medicine

## 2018-08-30 ENCOUNTER — Other Ambulatory Visit: Payer: Self-pay

## 2018-08-30 ENCOUNTER — Encounter: Payer: Self-pay | Admitting: Internal Medicine

## 2018-08-30 ENCOUNTER — Ambulatory Visit (INDEPENDENT_AMBULATORY_CARE_PROVIDER_SITE_OTHER): Payer: BC Managed Care – PPO | Admitting: Internal Medicine

## 2018-08-30 DIAGNOSIS — F39 Unspecified mood [affective] disorder: Secondary | ICD-10-CM | POA: Diagnosis not present

## 2018-08-30 MED ORDER — FLUOXETINE HCL 20 MG PO CAPS: 20.0000 mg | ORAL_CAPSULE | ORAL | 3 refills | Status: DC

## 2018-08-30 NOTE — Assessment & Plan Note (Signed)
Mood seems more stable on the abilify at the higher dose Mild "fog" feeling but no serious side effects Has cut down the fluoxetine to 20mg  daily  May be bipolar variant Will have him cut the fluxoetine to 20mg  every other day Plan recheck in 3 months He will let me know if any problems in the meantime

## 2018-08-30 NOTE — Progress Notes (Signed)
Subjective:    Patient ID: Colin Price, male    DOB: Jul 10, 1965, 53 y.o.   MRN: [id number]  HPI Virtual visit for review of mood issues Identification done Reviewed billing and he gave consent He is in his truck and I am in my office  "things have gone fairly smoothly" Now taking the higher dose He feels his mood is more stable now Sleeping "heavier" Some grogginess in the morning---just takes a while to get going (like by after showering) No blow ups at work Feels the medication is helping Does have "zoned out--like a brain fog" at times (like in the office)  Has cut back on the fluoxetine--now only taking 20mg  daily No depressed Not really anxious but would flare with anger  Current Outpatient Medications on File Prior to Visit  Medication Sig Dispense Refill  . ARIPiprazole (ABILIFY) 10 MG tablet TAKE 1 TABLET BY MOUTH EVERYDAY AT BEDTIME 90 tablet 3  . FLUoxetine (PROZAC) 20 MG capsule Take 1 capsule (20 mg total) by mouth daily. (Patient taking differently: Take 10 mg by mouth daily. ) 180 capsule 3  . ibuprofen (ADVIL,MOTRIN) 200 MG tablet Take 200 mg by mouth every 6 (six) hours as needed.    . Multiple Vitamin (MULTIVITAMIN) tablet Take 1-2 by mouth three times a week.    Marland Kitchen omeprazole (PRILOSEC) 20 MG capsule Take 20 mg by mouth every other day.     No current facility-administered medications on file prior to visit.     No Known Allergies  Past Medical History:  Diagnosis Date  . ED (erectile dysfunction)   . GERD (gastroesophageal reflux disease)   . Kidney stones    multiple---has passed    Past Surgical History:  Procedure Laterality Date  . UROGENITAL SINUS REPAIR  7672   umbilicus sinus infection- repair  . WISDOM TOOTH EXTRACTION      Family History  Problem Relation Age of Onset  . Healthy Mother   . Hypertension Father   . Colon polyps Father   . Heart disease Maternal Grandfather   . Cancer Paternal Grandfather        died of  melanoma  . Colon cancer Neg Hx   . Esophageal cancer Neg Hx   . Rectal cancer Neg Hx   . Stomach cancer Neg Hx     Social History   Socioeconomic History  . Marital status: Single    Spouse name: Not on file  . Number of children: 1  . Years of education: Not on file  . Highest education level: Not on file  Occupational History  . Occupation: Counsellor    CommentCorporate treasurer  . Financial resource strain: Not on file  . Food insecurity    Worry: Not on file    Inability: Not on file  . Transportation needs    Medical: Not on file    Non-medical: Not on file  Tobacco Use  . Smoking status: Former Smoker    Packs/day: 0.25    Years: 10.00    Pack years: 2.50    Types: Cigarettes    Quit date: 03/02/2000    Years since quitting: 18.5  . Smokeless tobacco: Never Used  Substance and Sexual Activity  . Alcohol use: Yes    Alcohol/week: 3.0 standard drinks    Types: 3 Shots of liquor per week    Comment: occasional  . Drug use: No  . Sexual activity: Not on file  Lifestyle  .  Physical activity    Days per week: Not on file    Minutes per session: Not on file  . Stress: Not on file  Relationships  . Social Herbalist on phone: Not on file    Gets together: Not on file    Attends religious service: Not on file    Active member of club or organization: Not on file    Attends meetings of clubs or organizations: Not on file    Relationship status: Not on file  . Intimate partner violence    Fear of current or ex partner: Not on file    Emotionally abused: Not on file    Physically abused: Not on file    Forced sexual activity: Not on file  Other Topics Concern  . Not on file  Social History Narrative  . Not on file   Review of Systems Appetite is fine--but eating about the same Generally eating bigger lunch and lighter diner Weight seems stable    Objective:   Physical Exam  Constitutional: He appears well-developed. No  distress.  Psychiatric: He has a normal mood and affect. His behavior is normal.           Assessment & Plan:

## 2018-11-21 ENCOUNTER — Telehealth: Payer: Self-pay | Admitting: Internal Medicine

## 2018-11-21 NOTE — Telephone Encounter (Signed)
Patient is requesting a call back.  He stated he is needing some information from in last physical to submit for insurance purposes.

## 2018-11-21 NOTE — Telephone Encounter (Signed)
Spoke to pt. For some reason, labs were not drawn at his CPE in March. He has an appt in October and will get the labs drawn for his Biometric form.

## 2018-12-02 ENCOUNTER — Other Ambulatory Visit: Payer: Self-pay

## 2018-12-02 ENCOUNTER — Encounter: Payer: Self-pay | Admitting: Internal Medicine

## 2018-12-02 ENCOUNTER — Ambulatory Visit (INDEPENDENT_AMBULATORY_CARE_PROVIDER_SITE_OTHER): Payer: BC Managed Care – PPO | Admitting: Internal Medicine

## 2018-12-02 VITALS — BP 118/82 | HR 77 | Temp 98.0°F | Ht 70.0 in | Wt 188.0 lb

## 2018-12-02 DIAGNOSIS — F39 Unspecified mood [affective] disorder: Secondary | ICD-10-CM

## 2018-12-02 DIAGNOSIS — Z23 Encounter for immunization: Secondary | ICD-10-CM

## 2018-12-02 LAB — LIPID PANEL
Cholesterol: 176 mg/dL (ref 0–200)
HDL: 28.2 mg/dL — ABNORMAL LOW (ref >39.00)
NonHDL: 148.19
Total CHOL/HDL Ratio: 6
Triglycerides: 386 mg/dL — ABNORMAL HIGH (ref 0.0–149.0)
VLDL: 77.2 mg/dL — ABNORMAL HIGH (ref 0.0–40.0)

## 2018-12-02 LAB — GLUCOSE, RANDOM: Glucose, Bld: 90 mg/dL (ref 70–99)

## 2018-12-02 LAB — LDL CHOLESTEROL, DIRECT: Direct LDL: 79 mg/dL

## 2018-12-02 NOTE — Addendum Note (Signed)
Addended by: Pilar Grammes on: 12/02/2018 12:45 PM   Modules accepted: Orders

## 2018-12-02 NOTE — Progress Notes (Signed)
Subjective:    Patient ID: Colin Price, male    DOB: 12/05/1965, 53 y.o.   MRN: [id number]  HPI Here for follow up of mood problems and anger  Couldn't tolerate the medications Had the brain "fog" His anger, etc seemed to go away He feels it was related to someone at work---his direct supervisor She put her notice in--and they just told her to leave  He has not had similar problems in the past Does note past anger issues---but mild and not in a while (other than with this person)  Current Outpatient Medications on File Prior to Visit  Medication Sig Dispense Refill  . ibuprofen (ADVIL,MOTRIN) 200 MG tablet Take 200 mg by mouth every 6 (six) hours as needed.    . Multiple Vitamin (MULTIVITAMIN) tablet Take 1-2 by mouth three times a week.    Marland Kitchen omeprazole (PRILOSEC) 20 MG capsule Take 20 mg by mouth every other day.     No current facility-administered medications on file prior to visit.     No Known Allergies  Past Medical History:  Diagnosis Date  . ED (erectile dysfunction)   . GERD (gastroesophageal reflux disease)   . Kidney stones    multiple---has passed    Past Surgical History:  Procedure Laterality Date  . UROGENITAL SINUS REPAIR  [id number]   umbilicus sinus infection- repair  . WISDOM TOOTH EXTRACTION      Family History  Problem Relation Age of Onset  . Healthy Mother   . Hypertension Father   . Colon polyps Father   . Heart disease Maternal Grandfather   . Cancer Paternal Grandfather        died of melanoma  . Colon cancer Neg Hx   . Esophageal cancer Neg Hx   . Rectal cancer Neg Hx   . Stomach cancer Neg Hx     Social History   Socioeconomic History  . Marital status: Single    Spouse name: Not on file  . Number of children: 1  . Years of education: Not on file  . Highest education level: Not on file  Occupational History  . Occupation: Counsellor    CommentCorporate treasurer  . Financial resource strain: Not on file  .  Food insecurity    Worry: Not on file    Inability: Not on file  . Transportation needs    Medical: Not on file    Non-medical: Not on file  Tobacco Use  . Smoking status: Former Smoker    Packs/day: 0.25    Years: 10.00    Pack years: 2.50    Types: Cigarettes    Quit date: 03/02/2000    Years since quitting: 18.7  . Smokeless tobacco: Never Used  Substance and Sexual Activity  . Alcohol use: Yes    Alcohol/week: 3.0 standard drinks    Types: 3 Shots of liquor per week    Comment: occasional  . Drug use: No  . Sexual activity: Not on file  Lifestyle  . Physical activity    Days per week: Not on file    Minutes per session: Not on file  . Stress: Not on file  Relationships  . Social Herbalist on phone: Not on file    Gets together: Not on file    Attends religious service: Not on file    Active member of club or organization: Not on file    Attends meetings of clubs or organizations: Not  on file    Relationship status: Not on file  . Intimate partner violence    Fear of current or ex partner: Not on file    Emotionally abused: Not on file    Physically abused: Not on file    Forced sexual activity: Not on file  Other Topics Concern  . Not on file  Social History Narrative  . Not on file   Review of Systems  Sleeping well Appetite is good     Objective:   Physical Exam  Constitutional: He appears well-developed. No distress.  Psychiatric: He has a normal mood and affect. His behavior is normal.           Assessment & Plan:

## 2018-12-02 NOTE — Assessment & Plan Note (Signed)
Improved Seemed to be related to a single supervisor--who has now left Off the meds and feels good Discussed anger management and avoiding losing control (though hopefully this was just a single episode)

## 2019-05-19 ENCOUNTER — Other Ambulatory Visit: Payer: Self-pay

## 2019-05-19 ENCOUNTER — Ambulatory Visit (INDEPENDENT_AMBULATORY_CARE_PROVIDER_SITE_OTHER): Payer: BC Managed Care – PPO | Admitting: Internal Medicine

## 2019-05-19 ENCOUNTER — Encounter: Payer: Self-pay | Admitting: Internal Medicine

## 2019-05-19 VITALS — BP 126/80 | HR 87 | Temp 97.3°F | Ht 70.0 in | Wt 186.0 lb

## 2019-05-19 DIAGNOSIS — F39 Unspecified mood [affective] disorder: Secondary | ICD-10-CM | POA: Diagnosis not present

## 2019-05-19 DIAGNOSIS — K219 Gastro-esophageal reflux disease without esophagitis: Secondary | ICD-10-CM | POA: Diagnosis not present

## 2019-05-19 DIAGNOSIS — Z0000 Encounter for general adult medical examination without abnormal findings: Secondary | ICD-10-CM

## 2019-05-19 NOTE — Assessment & Plan Note (Signed)
Controlled with every other day PPI

## 2019-05-19 NOTE — Progress Notes (Signed)
Subjective:    Patient ID: Colin Price, male    DOB: 05/23/1965, 54 y.o.   MRN: [id number]  HPI Here for physical This visit occurred during the SARS-CoV-2 public health emergency.  Safety protocols were in place, including screening questions prior to the visit, additional usage of staff PPE, and extensive cleaning of exam room while observing appropriate contact time as indicated for disinfecting solutions.   Doing well No anger, anxiety or mood issues Work is fine  Off all meds now  Still on omeprazole every other day That controls heartburn No dysphagia  Current Outpatient Medications on File Prior to Visit  Medication Sig Dispense Refill  . ibuprofen (ADVIL,MOTRIN) 200 MG tablet Take 200 mg by mouth every 6 (six) hours as needed.    . Multiple Vitamin (MULTIVITAMIN) tablet Take 1-2 by mouth three times a week.    Marland Kitchen omeprazole (PRILOSEC) 20 MG capsule Take 20 mg by mouth every other day.     No current facility-administered medications on file prior to visit.    No Known Allergies  Past Medical History:  Diagnosis Date  . ED (erectile dysfunction)   . GERD (gastroesophageal reflux disease)   . Kidney stones    multiple---has passed    Past Surgical History:  Procedure Laterality Date  . UROGENITAL SINUS REPAIR  [id number]   umbilicus sinus infection- repair  . WISDOM TOOTH EXTRACTION      Family History  Problem Relation Age of Onset  . Healthy Mother   . Hypertension Father   . Colon polyps Father   . Heart disease Maternal Grandfather   . Cancer Paternal Grandfather        died of melanoma  . Colon cancer Neg Hx   . Esophageal cancer Neg Hx   . Rectal cancer Neg Hx   . Stomach cancer Neg Hx     Social History   Socioeconomic History  . Marital status: Single    Spouse name: Not on file  . Number of children: 1  . Years of education: Not on file  . Highest education level: Not on file  Occupational History  . Occupation: Counsellor    CommentDispensing optician  Tobacco Use  . Smoking status: Former Smoker    Packs/day: 0.25    Years: 10.00    Pack years: 2.50    Types: Cigarettes    Quit date: 03/02/2000    Years since quitting: 19.2  . Smokeless tobacco: Never Used  Substance and Sexual Activity  . Alcohol use: Yes    Alcohol/week: 3.0 standard drinks    Types: 3 Shots of liquor per week    Comment: occasional  . Drug use: No  . Sexual activity: Not on file  Other Topics Concern  . Not on file  Social History Narrative  . Not on file   Social Determinants of Health   Financial Resource Strain:   . Difficulty of Paying Living Expenses:   Food Insecurity:   . Worried About Charity fundraiser in the Last Year:   . Arboriculturist in the Last Year:   Transportation Needs:   . Film/video editor (Medical):   Marland Kitchen Lack of Transportation (Non-Medical):   Physical Activity:   . Days of Exercise per Week:   . Minutes of Exercise per Session:   Stress:   . Feeling of Stress :   Social Connections:   . Frequency of Communication with Friends and Family:   .  Frequency of Social Gatherings with Friends and Family:   . Attends Religious Services:   . Active Member of Clubs or Organizations:   . Attends Archivist Meetings:   Marland Kitchen Marital Status:   Intimate Partner Violence:   . Fear of Current or Ex-Partner:   . Emotionally Abused:   Marland Kitchen Physically Abused:   . Sexually Abused:    Review of Systems  Constitutional: Negative for unexpected weight change.       Working a lot--no exercise Weight stable Wears seat belt  HENT: Negative for dental problem, hearing loss and tinnitus.        Keeps up with dentist  Eyes: Negative for visual disturbance.       No diplopia or unilateral vision loss   Respiratory: Negative for cough, chest tightness and shortness of breath.   Cardiovascular: Negative for chest pain and leg swelling.       ?vague sense of heart when resting. No racing  Gastrointestinal: Negative  for abdominal pain, blood in stool and constipation.  Endocrine: Negative for polydipsia and polyuria.  Genitourinary: Negative for difficulty urinating and urgency.  Musculoskeletal: Negative for arthralgias, back pain and joint swelling.  Skin: Negative for rash.       No suspicious lesions  Allergic/Immunologic: Negative for environmental allergies and immunocompromised state.  Neurological: Negative for dizziness, syncope, light-headedness and headaches.  Hematological: Negative for adenopathy. Does not bruise/bleed easily.  Psychiatric/Behavioral: Negative for dysphoric mood and sleep disturbance. The patient is not nervous/anxious.        Objective:   Physical Exam  Constitutional: He is oriented to person, place, and time. He appears well-developed. No distress.  HENT:  Head: Normocephalic and atraumatic.  Right Ear: External ear normal.  Left Ear: External ear normal.  Mouth/Throat: Oropharynx is clear and moist. No oropharyngeal exudate.  Eyes: Pupils are equal, round, and reactive to light. Conjunctivae are normal.  Neck: No thyromegaly present.  Cardiovascular: Normal rate, regular rhythm, normal heart sounds and intact distal pulses. Exam reveals no gallop.  No murmur heard. Respiratory: Effort normal and breath sounds normal. No respiratory distress. He has no wheezes. He has no rales.  GI: Soft. There is no abdominal tenderness.  Musculoskeletal:        General: No tenderness or edema.  Lymphadenopathy:    He has no cervical adenopathy.  Neurological: He is alert and oriented to person, place, and time.  Skin: No rash noted. No erythema.  Psychiatric: He has a normal mood and affect. His behavior is normal.           Assessment & Plan:

## 2019-05-19 NOTE — Assessment & Plan Note (Signed)
Healthy Discussed finding time for fitness Consider PSA at 81 Colon due 2027 Flu vaccine in fall Advised to take COVID vaccine

## 2019-05-19 NOTE — Assessment & Plan Note (Signed)
Better Off meds

## 2020-05-24 ENCOUNTER — Other Ambulatory Visit: Payer: Self-pay

## 2020-05-24 ENCOUNTER — Encounter: Payer: Self-pay | Admitting: Internal Medicine

## 2020-05-24 ENCOUNTER — Ambulatory Visit (INDEPENDENT_AMBULATORY_CARE_PROVIDER_SITE_OTHER): Payer: BC Managed Care – PPO | Admitting: Internal Medicine

## 2020-05-24 VITALS — BP 138/90 | HR 102 | Temp 96.9°F | Ht 69.75 in | Wt 182.0 lb

## 2020-05-24 DIAGNOSIS — Z125 Encounter for screening for malignant neoplasm of prostate: Secondary | ICD-10-CM | POA: Diagnosis not present

## 2020-05-24 DIAGNOSIS — Z23 Encounter for immunization: Secondary | ICD-10-CM

## 2020-05-24 DIAGNOSIS — K219 Gastro-esophageal reflux disease without esophagitis: Secondary | ICD-10-CM | POA: Diagnosis not present

## 2020-05-24 DIAGNOSIS — Z0000 Encounter for general adult medical examination without abnormal findings: Secondary | ICD-10-CM

## 2020-05-24 LAB — CBC
HCT: 44.2 % (ref 39.0–52.0)
Hemoglobin: 15.3 g/dL (ref 13.0–17.0)
MCHC: 34.6 g/dL (ref 30.0–36.0)
MCV: 87.8 fl (ref 78.0–100.0)
Platelets: 254 10*3/uL (ref 150.0–400.0)
RBC: 5.03 Mil/uL (ref 4.22–5.81)
RDW: 13.4 % (ref 11.5–15.5)
WBC: 9.1 10*3/uL (ref 4.0–10.5)

## 2020-05-24 LAB — COMPREHENSIVE METABOLIC PANEL
ALT: 19 U/L (ref 0–53)
AST: 15 U/L (ref 0–37)
Albumin: 4.3 g/dL (ref 3.5–5.2)
Alkaline Phosphatase: 93 U/L (ref 39–117)
BUN: 17 mg/dL (ref 6–23)
CO2: 26 mEq/L (ref 19–32)
Calcium: 9.6 mg/dL (ref 8.4–10.5)
Chloride: 102 mEq/L (ref 96–112)
Creatinine, Ser: 0.92 mg/dL (ref 0.40–1.50)
GFR: 93.98 mL/min (ref >60.00)
Glucose, Bld: 89 mg/dL (ref 70–99)
Potassium: 4.1 mEq/L (ref 3.5–5.1)
Sodium: 138 mEq/L (ref 135–145)
Total Bilirubin: 0.8 mg/dL (ref 0.2–1.2)
Total Protein: 7.2 g/dL (ref 6.0–8.3)

## 2020-05-24 LAB — PSA: PSA: 0.81 ng/mL (ref 0.10–4.00)

## 2020-05-24 NOTE — Addendum Note (Signed)
Addended by: Pilar Grammes on: 05/24/2020 09:03 AM   Modules accepted: Orders

## 2020-05-24 NOTE — Assessment & Plan Note (Signed)
Quiet with every other day PPI

## 2020-05-24 NOTE — Assessment & Plan Note (Signed)
Healthy Urged him to consider COVID vaccine if it flares again Discussed PSA---will check shingrix today Flu vaccine in the fall Discussed fitness

## 2020-05-24 NOTE — Progress Notes (Signed)
Subjective:    Patient ID: Colin Price, male    DOB: 1965/06/02, 55 y.o.   MRN: [id number]  HPI  Here for physical This visit occurred during the SARS-CoV-2 public health emergency.  Safety protocols were in place, including screening questions prior to the visit, additional usage of staff PPE, and extensive cleaning of exam room while observing appropriate contact time as indicated for disinfecting solutions.   No mood problems now  Continues on the omeprazole about every other day No heartburn No dysphagia  Same job but his company was bought out Satisfied with job  Current Outpatient Medications on File Prior to Visit  Medication Sig Dispense Refill  . ibuprofen (ADVIL,MOTRIN) 200 MG tablet Take 200 mg by mouth every 6 (six) hours as needed.    . Multiple Vitamin (MULTIVITAMIN) tablet Take 1-2 by mouth three times a week.    Marland Kitchen omeprazole (PRILOSEC) 20 MG capsule Take 20 mg by mouth every other day.     No current facility-administered medications on file prior to visit.    No Known Allergies  Past Medical History:  Diagnosis Date  . ED (erectile dysfunction)   . GERD (gastroesophageal reflux disease)   . Kidney stones    multiple---has passed    Past Surgical History:  Procedure Laterality Date  . UROGENITAL SINUS REPAIR  1950   umbilicus sinus infection- repair  . WISDOM TOOTH EXTRACTION      Family History  Problem Relation Age of Onset  . Healthy Mother   . Hypertension Father   . Colon polyps Father   . Heart disease Maternal Grandfather   . Cancer Paternal Grandfather        died of melanoma  . Colon cancer Neg Hx   . Esophageal cancer Neg Hx   . Rectal cancer Neg Hx   . Stomach cancer Neg Hx     Social History   Socioeconomic History  . Marital status: Single    Spouse name: Not on file  . Number of children: 1  . Years of education: Not on file  . Highest education level: Not on file  Occupational History  . Occupation: Counsellor     CommentNetwork engineer  Tobacco Use  . Smoking status: Former Smoker    Packs/day: 0.25    Years: 10.00    Pack years: 2.50    Types: Cigarettes    Quit date: 03/02/2000    Years since quitting: 20.2  . Smokeless tobacco: Never Used  Substance and Sexual Activity  . Alcohol use: Yes    Alcohol/week: 3.0 standard drinks    Types: 3 Shots of liquor per week    Comment: occasional  . Drug use: No  . Sexual activity: Not on file  Other Topics Concern  . Not on file  Social History Narrative  . Not on file   Social Determinants of Health   Financial Resource Strain: Not on file  Food Insecurity: Not on file  Transportation Needs: Not on file  Physical Activity: Not on file  Stress: Not on file  Social Connections: Not on file  Intimate Partner Violence: Not on file   Review of Systems  Constitutional:       Careful with eating No set exercise--discussed Wears seat belt  HENT: Negative for dental problem.        Right ear "hiss"----hearing okay Keeps up with dentist  Eyes: Negative for visual disturbance.       No diplopia or unilateral  vision loss  Respiratory: Negative for cough, chest tightness and shortness of breath.   Cardiovascular: Negative for leg swelling.       Rare brief racing heart--at rest.  Gastrointestinal: Negative for blood in stool and constipation.  Genitourinary: Negative for difficulty urinating and urgency.       No sexual problems  Musculoskeletal: Negative for back pain and joint swelling.       Chronic right shoulder pain---"wore out" No Rx  Skin: Negative for rash.       No suspicious lesions  Allergic/Immunologic: Negative for environmental allergies and immunocompromised state.  Neurological: Negative for dizziness, syncope, light-headedness and headaches.  Hematological: Negative for adenopathy. Does not bruise/bleed easily.  Psychiatric/Behavioral: Negative for dysphoric mood. The patient is not nervous/anxious.        Some night  time awakening---1 hour before early wake up time (3-4AM). Discussed sleep compression        Objective:   Physical Exam Constitutional:      Appearance: Normal appearance.  HENT:     Right Ear: Ear canal and external ear normal.     Left Ear: Tympanic membrane, ear canal and external ear normal.     Ears:     Comments: Cerumen on right    Mouth/Throat:     Pharynx: No oropharyngeal exudate or posterior oropharyngeal erythema.  Eyes:     Conjunctiva/sclera: Conjunctivae normal.     Pupils: Pupils are equal, round, and reactive to light.  Cardiovascular:     Rate and Rhythm: Normal rate and regular rhythm.     Pulses: Normal pulses.     Heart sounds: No murmur heard. No gallop.   Pulmonary:     Effort: Pulmonary effort is normal.     Breath sounds: Normal breath sounds. No wheezing or rales.  Abdominal:     Palpations: Abdomen is soft.     Tenderness: There is no abdominal tenderness.  Musculoskeletal:     Cervical back: Neck supple.     Right lower leg: No edema.     Left lower leg: No edema.     Comments: Benign exam of right shoulder---?tendonitis  Lymphadenopathy:     Cervical: No cervical adenopathy.  Skin:    General: Skin is warm.     Findings: No rash.  Neurological:     General: No focal deficit present.     Mental Status: He is alert and oriented to person, place, and time.  Psychiatric:        Mood and Affect: Mood normal.        Behavior: Behavior normal.            Assessment & Plan:

## 2020-11-07 ENCOUNTER — Other Ambulatory Visit: Payer: Self-pay

## 2020-11-07 ENCOUNTER — Ambulatory Visit (INDEPENDENT_AMBULATORY_CARE_PROVIDER_SITE_OTHER): Payer: BC Managed Care – PPO | Admitting: *Deleted

## 2020-11-07 DIAGNOSIS — Z23 Encounter for immunization: Secondary | ICD-10-CM | POA: Diagnosis not present

## 2020-11-08 IMAGING — CT CT RENAL STONE PROTOCOL
2 of 4 series · 15 of 46 positions shown, 17 images · non-contrast
Comparison: CT of the abdomen and pelvis performed 08/04/2011

CLINICAL DATA: Acute onset of right flank pain.

EXAM:
CT ABDOMEN AND PELVIS WITHOUT CONTRAST
TECHNIQUE: Multidetector CT imaging of the abdomen and pelvis was performed
following the standard protocol without IV contrast.

[Series 2: stone full standard · axial · 0.75mm/px · z∈[-1084,-594]mm · 12 of 108 slices shown, 14 images]
[im 5/108  soft-tissue]
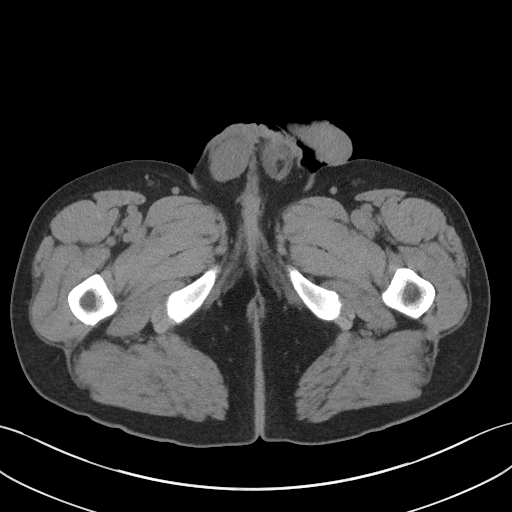
[im 5/108  bone]
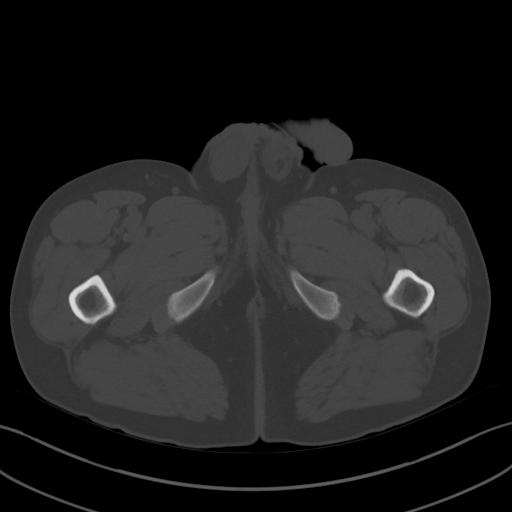
[im 14/108  soft-tissue]
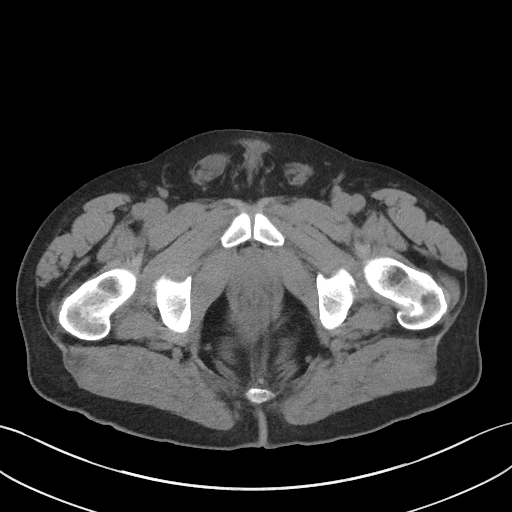
[im 23/108  soft-tissue]
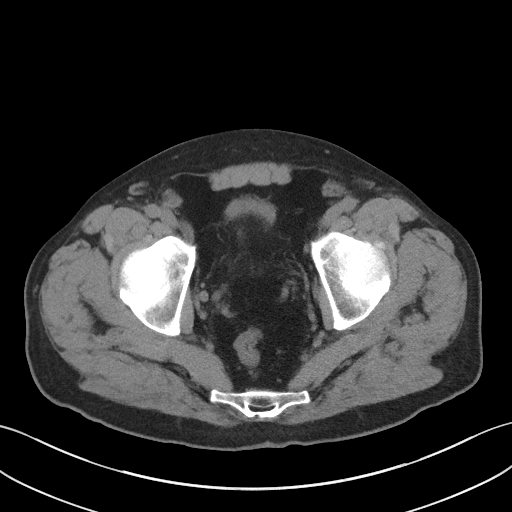
[im 32/108  soft-tissue]
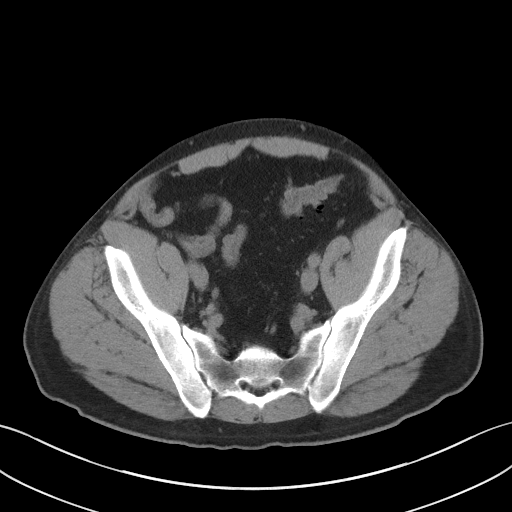
[im 41/108  soft-tissue]
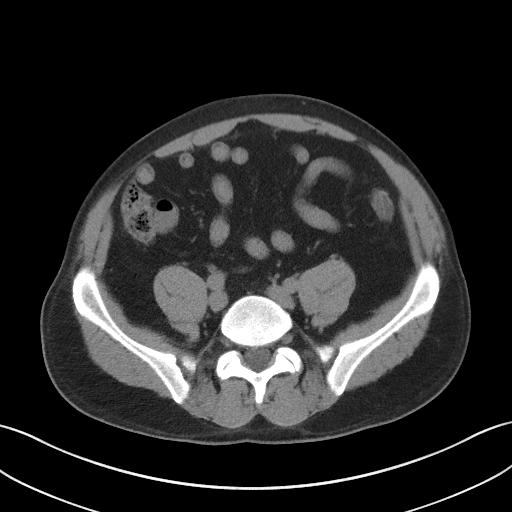
[im 50/108  soft-tissue]
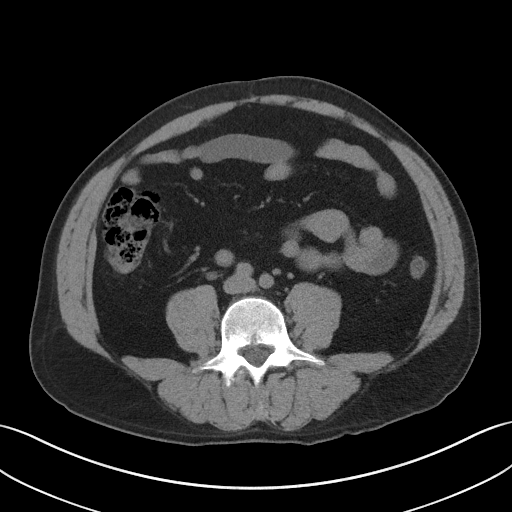
[im 58/108  soft-tissue]
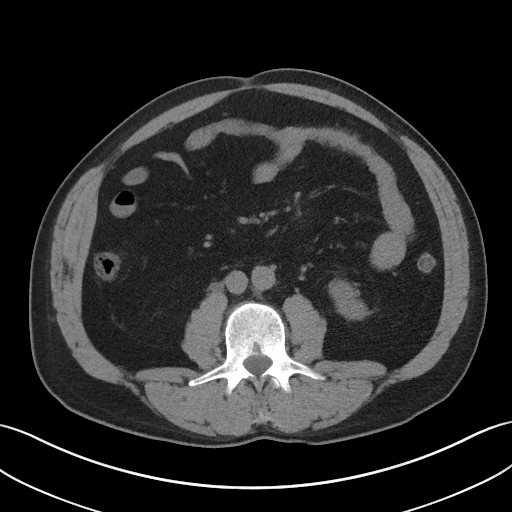
[im 67/108  soft-tissue]
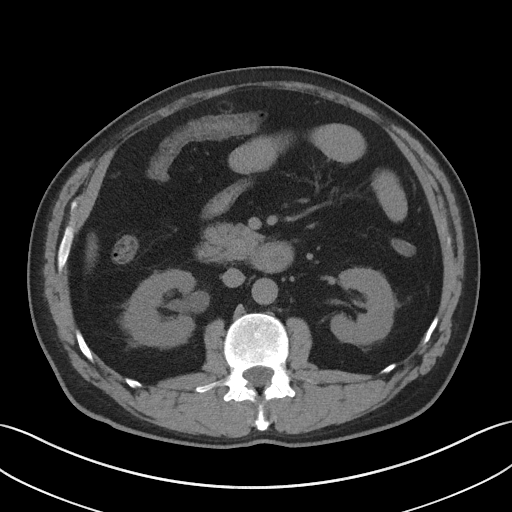
[im 76/108  soft-tissue]
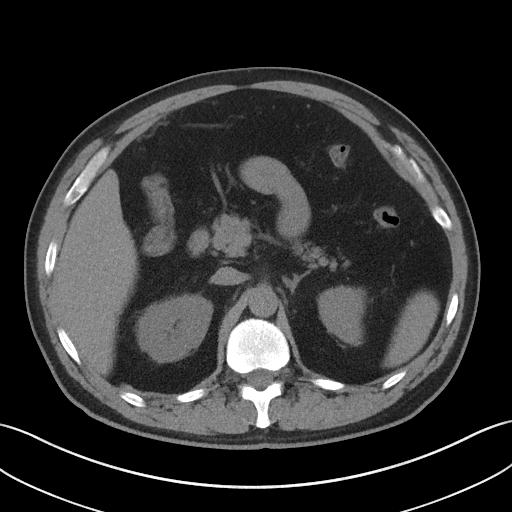
[im 76/108  bone]
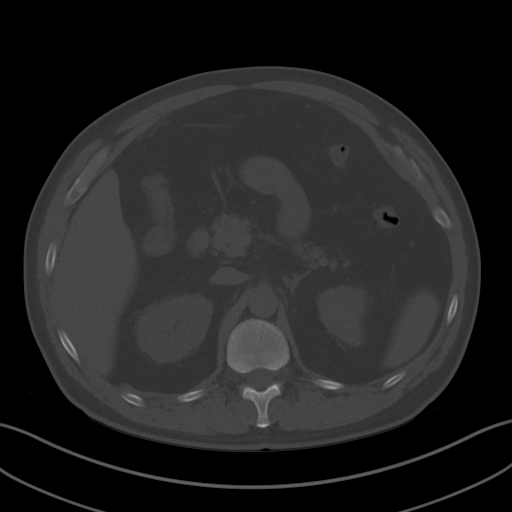
[im 85/108  soft-tissue]
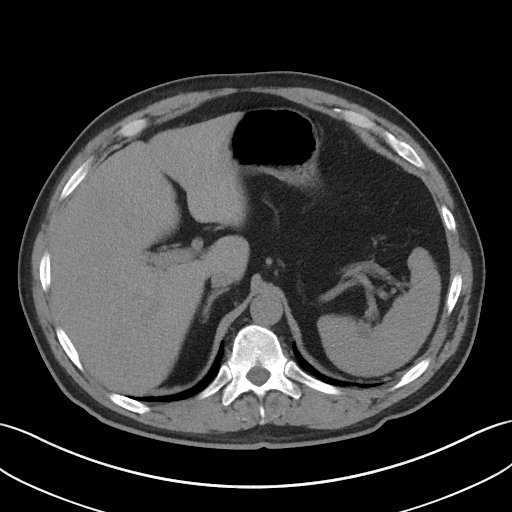
[im 94/108  soft-tissue]
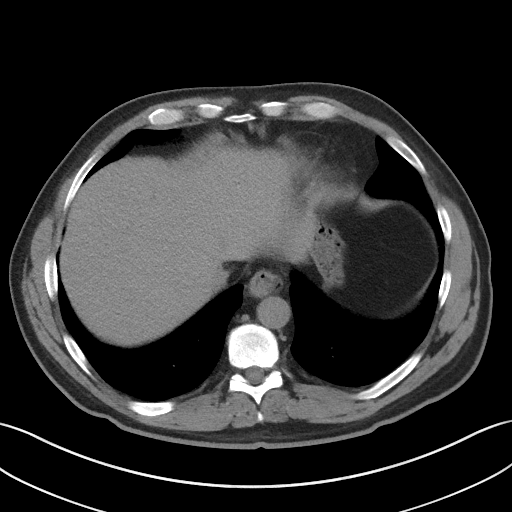
[im 103/108  soft-tissue]
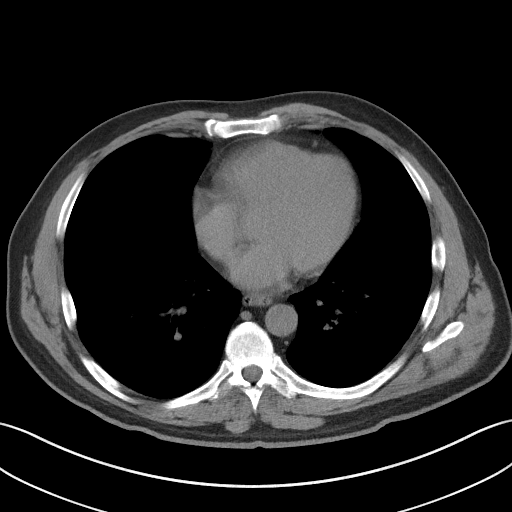

[Series 5: coronal · coronal · 0.78mm/px · 3 of 137 slices shown]
[im 46/137  soft-tissue]
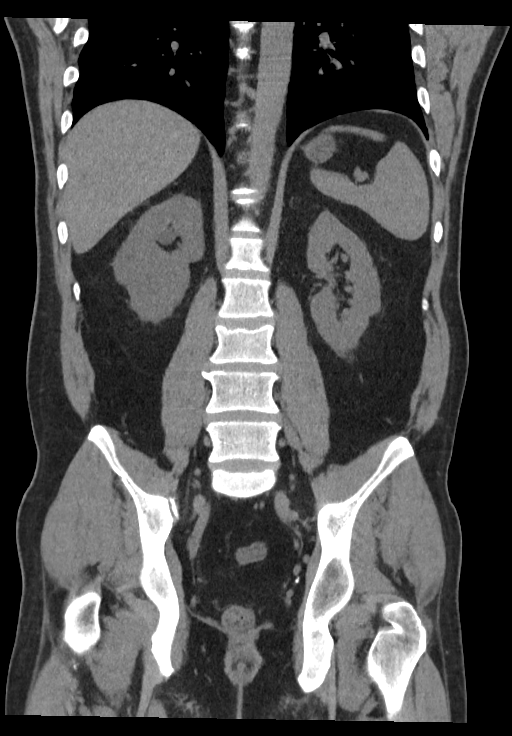
[im 61/137  soft-tissue]
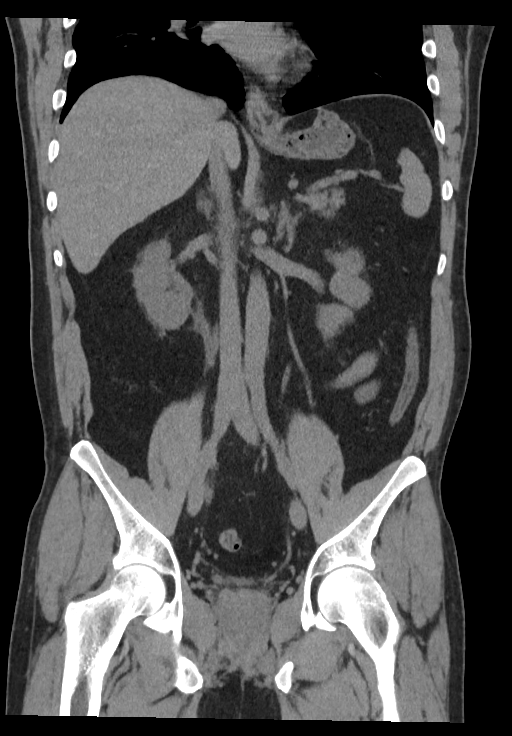
[im 76/137  soft-tissue]
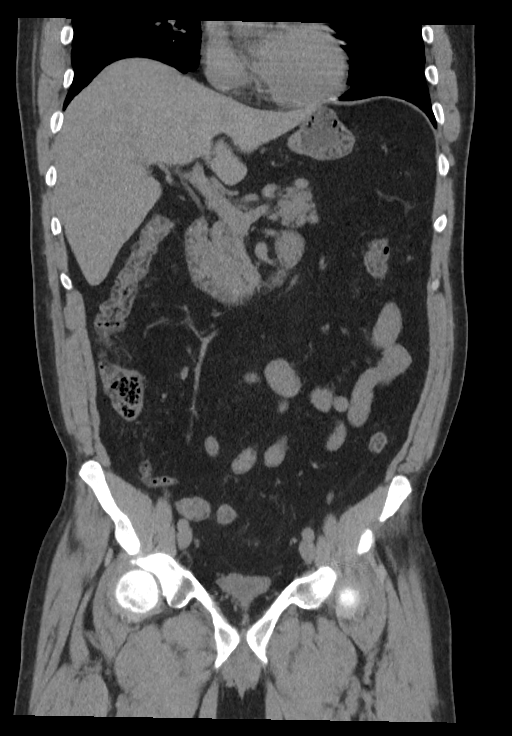

[15 of 46 positions shown; findings below may reference images not displayed]

FINDINGS: Lower chest: The visualized lung bases are grossly clear. The
visualized portions of the mediastinum are unremarkable.

Hepatobiliary: The liver is unremarkable in appearance. The
gallbladder is unremarkable in appearance. The common bile duct
remains normal in caliber.

Pancreas: The pancreas is within normal limits.

Spleen: The spleen is unremarkable in appearance.

Adrenals/Urinary Tract: The adrenal glands are unremarkable in
appearance.

There is mild right-sided hydronephrosis, with prominence of the
right ureter along its entire course. An obstructing 3 x 2 mm stone
is noted distally at the right vesicoureteral junction. A
nonobstructing 2 mm stone is noted at the interpole region of the
right kidney. Nonspecific perinephric stranding is noted
bilaterally.

Stomach/Bowel: The stomach is unremarkable in appearance. The small
bowel is within normal limits. The appendix is normal in caliber,
without evidence of appendicitis.

There is suggestion of mild wall thickening at the distal sigmoid
colon, which could reflect a mild infectious or inflammatory
process. Scattered diverticulosis is noted along the proximal
sigmoid colon, without evidence of diverticulitis.

Vascular/Lymphatic: The abdominal aorta is unremarkable in
appearance. The inferior vena cava is grossly unremarkable. No
retroperitoneal lymphadenopathy is seen. No pelvic sidewall
lymphadenopathy is identified.

Reproductive: The bladder is decompressed and not well
characterized. The prostate remains normal in size.

Other: No additional soft tissue abnormalities are seen.

Musculoskeletal: No acute osseous abnormalities are identified. The
visualized musculature is unremarkable in appearance.
IMPRESSION: 1. Mild right-sided hydronephrosis, with an obstructing 3 x 2 mm
stone noted distally at the right vesicoureteral junction.
2. Nonobstructing 2 mm stone at the interpole region of the right
kidney.
3. Suggestion of mild wall thickening at the distal sigmoid colon,
which could reflect a mild infectious or inflammatory process.
4. Scattered diverticulosis along the proximal sigmoid colon,
without evidence of diverticulitis.

## 2021-05-30 ENCOUNTER — Encounter: Payer: Self-pay | Admitting: Internal Medicine

## 2021-05-30 ENCOUNTER — Ambulatory Visit (INDEPENDENT_AMBULATORY_CARE_PROVIDER_SITE_OTHER): Payer: BC Managed Care – PPO | Admitting: Internal Medicine

## 2021-05-30 VITALS — BP 142/88 | HR 89 | Ht 70.0 in | Wt 185.4 lb

## 2021-05-30 DIAGNOSIS — Z0000 Encounter for general adult medical examination without abnormal findings: Secondary | ICD-10-CM | POA: Diagnosis not present

## 2021-05-30 DIAGNOSIS — G8929 Other chronic pain: Secondary | ICD-10-CM | POA: Diagnosis not present

## 2021-05-30 DIAGNOSIS — K219 Gastro-esophageal reflux disease without esophagitis: Secondary | ICD-10-CM | POA: Diagnosis not present

## 2021-05-30 DIAGNOSIS — M25511 Pain in right shoulder: Secondary | ICD-10-CM | POA: Diagnosis not present

## 2021-05-30 LAB — COMPREHENSIVE METABOLIC PANEL
ALT: 20 U/L (ref 0–53)
AST: 16 U/L (ref 0–37)
Albumin: 4.1 g/dL (ref 3.5–5.2)
Alkaline Phosphatase: 87 U/L (ref 39–117)
BUN: 14 mg/dL (ref 6–23)
CO2: 27 mEq/L (ref 19–32)
Calcium: 9.1 mg/dL (ref 8.4–10.5)
Chloride: 104 mEq/L (ref 96–112)
Creatinine, Ser: 0.91 mg/dL (ref 0.40–1.50)
GFR: 94.55 mL/min (ref >60.00)
Glucose, Bld: 85 mg/dL (ref 70–99)
Potassium: 3.9 mEq/L (ref 3.5–5.1)
Sodium: 138 mEq/L (ref 135–145)
Total Bilirubin: 0.9 mg/dL (ref 0.2–1.2)
Total Protein: 7.2 g/dL (ref 6.0–8.3)

## 2021-05-30 LAB — CBC
HCT: 42.4 % (ref 39.0–52.0)
Hemoglobin: 14.4 g/dL (ref 13.0–17.0)
MCHC: 34 g/dL (ref 30.0–36.0)
MCV: 88.1 fl (ref 78.0–100.0)
Platelets: 255 10*3/uL (ref 150.0–400.0)
RBC: 4.82 Mil/uL (ref 4.22–5.81)
RDW: 13.3 % (ref 11.5–15.5)
WBC: 8.2 10*3/uL (ref 4.0–10.5)

## 2021-05-30 NOTE — Progress Notes (Signed)
? ?Subjective:  ? ? Patient ID: Colin Price, male    DOB: 1965-08-11, 56 y.o.   MRN: [id number] ? ?HPI ?Here for physical ? ?Recent dental visit---told to take B vitamins due to "something to do with my tongue" ?He has no symptoms with tongue ?Does take a MVI about every other day ? ?No regular exercise---discussed  ?Does yard work ? ?Stopped golf due to his right shoulder ?Ibuprofen does help -- '600mg'$  bid  ?Hears some grinding with full abduction ? ?Still takes omeprazole every other day or so ? ?Current Outpatient Medications on File Prior to Visit  ?Medication Sig Dispense Refill  ? B Complex-C (VITAMIN B + C COMPLEX PO) Take by mouth.    ? ibuprofen (ADVIL,MOTRIN) 200 MG tablet Take 200 mg by mouth every 6 (six) hours as needed.    ? Multiple Vitamin (MULTIVITAMIN) tablet Take 1-2 by mouth three times a week.    ? omeprazole (PRILOSEC) 20 MG capsule Take 20 mg by mouth every other day.    ? ?No current facility-administered medications on file prior to visit.  ? ? ?No Known Allergies ? ?Past Medical History:  ?Diagnosis Date  ? ED (erectile dysfunction)   ? GERD (gastroesophageal reflux disease)   ? Kidney stones   ? multiple---has passed  ? ? ?Past Surgical History:  ?Procedure Laterality Date  ? UROGENITAL SINUS REPAIR  0938  ? umbilicus sinus infection- repair  ? WISDOM TOOTH EXTRACTION    ? ? ?Family History  ?Problem Relation Age of Onset  ? Healthy Mother   ? Hypertension Father   ? Colon polyps Father   ? Heart disease Maternal Grandfather   ? Cancer Paternal Grandfather   ?     died of melanoma  ? Colon cancer Neg Hx   ? Esophageal cancer Neg Hx   ? Rectal cancer Neg Hx   ? Stomach cancer Neg Hx   ? ? ?Social History  ? ?Socioeconomic History  ? Marital status: Single  ?  Spouse name: Not on file  ? Number of children: 1  ? Years of education: Not on file  ? Highest education level: Not on file  ?Occupational History  ? Occupation: Counsellor  ?  Comment: [employer]  ?Tobacco Use  ? Smoking  status: Former  ?  Packs/day: 0.25  ?  Years: 10.00  ?  Pack years: 2.50  ?  Types: Cigarettes  ?  Quit date: 03/02/2000  ?  Years since quitting: 21.2  ? Smokeless tobacco: Never  ?Substance and Sexual Activity  ? Alcohol use: Yes  ?  Alcohol/week: 3.0 standard drinks  ?  Types: 3 Shots of liquor per week  ?  Comment: occasional  ? Drug use: No  ? Sexual activity: Not on file  ?Other Topics Concern  ? Not on file  ?Social History Narrative  ? Not on file  ? ?Social Determinants of Health  ? ?Financial Resource Strain: Not on file  ?Food Insecurity: Not on file  ?Transportation Needs: Not on file  ?Physical Activity: Not on file  ?Stress: Not on file  ?Social Connections: Not on file  ?Intimate Partner Violence: Not on file  ? ?Review of Systems  ?Constitutional:  Negative for fatigue and unexpected weight change.  ?     Wears seat belt  ?HENT:  Positive for tinnitus. Negative for hearing loss and trouble swallowing.   ?     Is getting a filling done  ?Eyes:  Negative  for visual disturbance.  ?     No diplopia or unilateral vision loss  ?Respiratory:  Negative for cough, chest tightness and shortness of breath.   ?Cardiovascular:  Negative for chest pain and leg swelling.  ?     Will occasionally feel heart race briefly for a few seconds--when lying down in bed  ?Gastrointestinal:  Negative for blood in stool and constipation.  ?     No heartburn on omeprazole  ?Endocrine: Negative for polydipsia and polyuria.  ?Genitourinary:  Negative for difficulty urinating and urgency.  ?     Nocturia x 1 ?No sexual problems  ?Musculoskeletal:  Positive for arthralgias. Negative for back pain and joint swelling.  ?Skin:  Negative for rash.  ?     No suspicious lesions  ?Allergic/Immunologic: Negative for environmental allergies and immunocompromised state.  ?Neurological:  Negative for dizziness, syncope, light-headedness and headaches.  ?Hematological:  Negative for adenopathy. Does not bruise/bleed easily.   ?Psychiatric/Behavioral:  Negative for dysphoric mood and sleep disturbance. The patient is not nervous/anxious.   ? ?   ?Objective:  ? Physical Exam ?Constitutional:   ?   Appearance: Normal appearance.  ?HENT:  ?   Mouth/Throat:  ?   Pharynx: No oropharyngeal exudate or posterior oropharyngeal erythema.  ?Eyes:  ?   Conjunctiva/sclera: Conjunctivae normal.  ?   Pupils: Pupils are equal, round, and reactive to light.  ?Cardiovascular:  ?   Rate and Rhythm: Normal rate and regular rhythm.  ?   Pulses: Normal pulses.  ?   Heart sounds: No murmur heard. ?  No gallop.  ?Pulmonary:  ?   Effort: Pulmonary effort is normal.  ?   Breath sounds: Normal breath sounds. No wheezing or rales.  ?Abdominal:  ?   Palpations: Abdomen is soft.  ?   Tenderness: There is no abdominal tenderness.  ?Musculoskeletal:  ?   Cervical back: Neck supple.  ?   Right lower leg: No edema.  ?   Left lower leg: No edema.  ?Lymphadenopathy:  ?   Cervical: No cervical adenopathy.  ?Skin: ?   Findings: No lesion or rash.  ?Neurological:  ?   General: No focal deficit present.  ?   Mental Status: He is alert and oriented to person, place, and time.  ?Psychiatric:     ?   Mood and Affect: Mood normal.     ?   Behavior: Behavior normal.  ?  ? ? ? ? ?   ?Assessment & Plan:  ? ?

## 2021-05-30 NOTE — Patient Instructions (Signed)
You can try over the counter diclofenac gel for your shoulder  ?

## 2021-05-30 NOTE — Assessment & Plan Note (Signed)
Does well with omeprazole every other day ?

## 2021-05-30 NOTE — Assessment & Plan Note (Signed)
Exam is fairly benign ?Discussed trying diclofenac gel ?Okay to use ibuprofen on golf days ?

## 2021-05-30 NOTE — Addendum Note (Signed)
Addended by: Viviana Simpler I on: 05/30/2021 08:42 AM ? ? Modules accepted: Orders ? ?

## 2021-05-30 NOTE — Assessment & Plan Note (Signed)
Healthy ?Discussed exercise ?Colon due 2027 ?Defer PSA to next year ?Flu vaccine in the fall ?Still prefers no COVID vaccine ?

## 2021-06-19 ENCOUNTER — Ambulatory Visit (INDEPENDENT_AMBULATORY_CARE_PROVIDER_SITE_OTHER): Payer: BC Managed Care – PPO | Admitting: Internal Medicine

## 2021-06-19 ENCOUNTER — Encounter: Payer: Self-pay | Admitting: Internal Medicine

## 2021-06-19 DIAGNOSIS — R030 Elevated blood-pressure reading, without diagnosis of hypertension: Secondary | ICD-10-CM | POA: Insufficient documentation

## 2021-06-19 NOTE — Progress Notes (Signed)
? ?Subjective:  ? ? Patient ID: Colin Price, male    DOB: 1965-03-09, 56 y.o.   MRN: [id number] ? ?HPI ?Here due to elevated blood pressure ? 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 ?No NSAIDs then ?No emotional issues ?Has elevated BP at each CDL ? ?No chest pain or SOB ?No dizziness or syncope ?Current Outpatient Medications on File Prior to Visit  ?Medication Sig Dispense Refill  ? ibuprofen (ADVIL,MOTRIN) 200 MG tablet Take 200 mg by mouth every 6 (six) hours as needed.    ? Multiple Vitamin (MULTIVITAMIN) tablet Take 1-2 by mouth three times a week.    ? omeprazole (PRILOSEC) 20 MG capsule Take 20 mg by mouth every other day.    ? B Complex-C (VITAMIN B + C COMPLEX PO) Take by mouth. (Patient not taking: Reported on 06/19/2021)    ? ?No current facility-administered medications on file prior to visit.  ? ? ?No Known Allergies ? ?Past Medical History:  ?Diagnosis Date  ? ED (erectile dysfunction)   ? GERD (gastroesophageal reflux disease)   ? Kidney stones   ? multiple---has passed  ? ? ?Past Surgical History:  ?Procedure Laterality Date  ? UROGENITAL SINUS REPAIR  0814  ? umbilicus sinus infection- repair  ? WISDOM TOOTH EXTRACTION    ? ? ?Family History  ?Problem Relation Age of Onset  ? Healthy Mother   ? Hypertension Father   ? Colon polyps Father   ? Heart disease Maternal Grandfather   ? Cancer Paternal Grandfather   ?     died of melanoma  ? Colon cancer Neg Hx   ? Esophageal cancer Neg Hx   ? Rectal cancer Neg Hx   ? Stomach cancer Neg Hx   ? ? ?Social History  ? ?Socioeconomic History  ? Marital status: Single  ?  Spouse name: Not on file  ? Number of children: 1  ? Years of education: Not on file  ? Highest education level: Not on file  ?Occupational History  ? Occupation: Counsellor  ?  Comment: [employer]  ?Tobacco Use  ? Smoking status: Former  ?  Packs/day: 0.25  ?  Years: 10.00   ?  Pack years: 2.50  ?  Types: Cigarettes  ?  Quit date: 03/02/2000  ?  Years since quitting: 21.3  ? Smokeless tobacco: Never  ?Substance and Sexual Activity  ? Alcohol use: Yes  ?  Alcohol/week: 3.0 standard drinks  ?  Types: 3 Shots of liquor per week  ?  Comment: occasional  ? Drug use: No  ? Sexual activity: Not on file  ?Other Topics Concern  ? Not on file  ?Social History Narrative  ? Not on file  ? ?Social Determinants of Health  ? ?Financial Resource Strain: Not on file  ?Food Insecurity: Not on file  ?Transportation Needs: Not on file  ?Physical Activity: Not on file  ?Stress: Not on file  ?Social Connections: Not on file  ?Intimate Partner Violence: Not on file  ? ?Review of Systems ?No headaches ?Sleep is not great---but gets at least 5 hours plus drifting off after ?Snores but never told apnea ?No depression ? ?   ?Objective:  ? Physical Exam ?Constitutional:   ?   Appearance: Normal appearance.  ?Neurological:  ?   Mental Status: He is alert.  ?Psychiatric:     ?  Mood and Affect: Mood normal.     ?   Behavior: Behavior normal.  ?  ? ? ? ? ?   ?Assessment & Plan:  ? ?

## 2021-06-19 NOTE — Patient Instructions (Signed)
Please get an arm blood pressure cuff and start taking your blood pressure (when relaxed--maybe once or twice a week). If you have persistent pressure over 140/90,let me know and we will start a medication. ? ?DASH Eating Plan ?DASH stands for Dietary Approaches to Stop Hypertension. The DASH eating plan is a healthy eating plan that has been shown to: ?Reduce high blood pressure (hypertension). ?Reduce your risk for type 2 diabetes, heart disease, and stroke. ?Help with weight loss. ?What are tips for following this plan? ?Reading food labels ?Check food labels for the amount of salt (sodium) per serving. Choose foods with less than 5 percent of the Daily Value of sodium. Generally, foods with less than 300 milligrams (mg) of sodium per serving fit into this eating plan. ?To find whole grains, look for the word "whole" as the first word in the ingredient list. ?Shopping ?Buy products labeled as "low-sodium" or "no salt added." ?Buy fresh foods. Avoid canned foods and pre-made or frozen meals. ?Cooking ?Avoid adding salt when cooking. Use salt-free seasonings or herbs instead of table salt or sea salt. Check with your health care provider or pharmacist before using salt substitutes. ?Do not fry foods. Cook foods using healthy methods such as baking, boiling, grilling, roasting, and broiling instead. ?Cook with heart-healthy oils, such as olive, canola, avocado, soybean, or sunflower oil. ?Meal planning ? ?Eat a balanced diet that includes: ?4 or more servings of fruits and 4 or more servings of vegetables each day. Try to fill one-half of your plate with fruits and vegetables. ?6-8 servings of whole grains each day. ?Less than 6 oz (170 g) of lean meat, poultry, or fish each day. A 3-oz (85-g) serving of meat is about the same size as a deck of cards. One egg equals 1 oz (28 g). ?2-3 servings of low-fat dairy each day. One serving is 1 cup (237 mL). ?1 serving of nuts, seeds, or beans 5 times each week. ?2-3  servings of heart-healthy fats. Healthy fats called omega-3 fatty acids are found in foods such as walnuts, flaxseeds, fortified milks, and eggs. These fats are also found in cold-water fish, such as sardines, salmon, and mackerel. ?Limit how much you eat of: ?Canned or prepackaged foods. ?Food that is high in trans fat, such as some fried foods. ?Food that is high in saturated fat, such as fatty meat. ?Desserts and other sweets, sugary drinks, and other foods with added sugar. ?Full-fat dairy products. ?Do not salt foods before eating. ?Do not eat more than 4 egg yolks a week. ?Try to eat at least 2 vegetarian meals a week. ?Eat more home-cooked food and less restaurant, buffet, and fast food. ?Lifestyle ?When eating at a restaurant, ask that your food be prepared with less salt or no salt, if possible. ?If you drink alcohol: ?Limit how much you use to: ?0-1 drink a day for women who are not pregnant. ?0-2 drinks a day for men. ?Be aware of how much alcohol is in your drink. In the [place] one drink equals one 12 oz bottle of beer (355 mL), one 5 oz glass of wine (148 mL), or one 1? oz glass of hard liquor (44 mL). ?General information ?Avoid eating more than 2,300 mg of salt a day. If you have hypertension, you may need to reduce your sodium intake to 1,500 mg a day. ?Work with your health care provider to maintain a healthy body weight or to lose weight. Ask what an ideal weight is for you. ?Get  at least 30 minutes of exercise that causes your heart to beat faster (aerobic exercise) most days of the week. Activities may include walking, swimming, or biking. ?Work with your health care provider or dietitian to adjust your eating plan to your individual calorie needs. ?What foods should I eat? ?Fruits ?All fresh, dried, or frozen fruit. Canned fruit in natural juice (without added sugar). ?Vegetables ?Fresh or frozen vegetables (raw, steamed, roasted, or grilled). Low-sodium or reduced-sodium tomato and vegetable  juice. Low-sodium or reduced-sodium tomato sauce and tomato paste. Low-sodium or reduced-sodium canned vegetables. ?Grains ?Whole-grain or whole-wheat bread. Whole-grain or whole-wheat pasta. Brown rice. Modena Morrow. Bulgur. Whole-grain and low-sodium cereals. Pita bread. Low-fat, low-sodium crackers. Whole-wheat flour tortillas. ?Meats and other proteins ?Skinless chicken or [place]. Ground chicken or [place]. Pork with fat trimmed off. Fish and seafood. Egg whites. Dried beans, peas, or lentils. Unsalted nuts, nut butters, and seeds. Unsalted canned beans. Lean cuts of beef with fat trimmed off. Low-sodium, lean precooked or cured meat, such as sausages or meat loaves. ?Dairy ?Low-fat (1%) or fat-free (skim) milk. Reduced-fat, low-fat, or fat-free cheeses. Nonfat, low-sodium ricotta or cottage cheese. Low-fat or nonfat yogurt. Low-fat, low-sodium cheese. ?Fats and oils ?Soft margarine without trans fats. Vegetable oil. Reduced-fat, low-fat, or light mayonnaise and salad dressings (reduced-sodium). Canola, safflower, olive, avocado, soybean, and sunflower oils. Avocado. ?Seasonings and condiments ?Herbs. Spices. Seasoning mixes without salt. ?Other foods ?Unsalted popcorn and pretzels. Fat-free sweets. ?The items listed above may not be a complete list of foods and beverages you can eat. Contact a dietitian for more information. ?What foods should I avoid? ?Fruits ?Canned fruit in a light or heavy syrup. Fried fruit. Fruit in cream or butter sauce. ?Vegetables ?Creamed or fried vegetables. Vegetables in a cheese sauce. Regular canned vegetables (not low-sodium or reduced-sodium). Regular canned tomato sauce and paste (not low-sodium or reduced-sodium). Regular tomato and vegetable juice (not low-sodium or reduced-sodium). Angie Fava. Olives. ?Grains ?Baked goods made with fat, such as croissants, muffins, or some breads. Dry pasta or rice meal packs. ?Meats and other proteins ?Fatty cuts of meat. Ribs. Fried meat.  Berniece Salines. Bologna, salami, and other precooked or cured meats, such as sausages or meat loaves. Fat from the back of a pig (fatback). Bratwurst. Salted nuts and seeds. Canned beans with added salt. Canned or smoked fish. Whole eggs or egg yolks. Chicken or [place] with skin. ?Dairy ?Whole or 2% milk, cream, and half-and-half. Whole or full-fat cream cheese. Whole-fat or sweetened yogurt. Full-fat cheese. Nondairy creamers. Whipped toppings. Processed cheese and cheese spreads. ?Fats and oils ?Butter. Stick margarine. Lard. Shortening. Ghee. Bacon fat. Tropical oils, such as coconut, palm kernel, or palm oil. ?Seasonings and condiments ?Onion salt, garlic salt, seasoned salt, table salt, and sea salt. Worcestershire sauce. Tartar sauce. Barbecue sauce. Teriyaki sauce. Soy sauce, including reduced-sodium. Steak sauce. Canned and packaged gravies. Fish sauce. Oyster sauce. Cocktail sauce. Store-bought horseradish. Ketchup. Mustard. Meat flavorings and tenderizers. Bouillon cubes. Hot sauces. Pre-made or packaged marinades. Pre-made or packaged taco seasonings. Relishes. Regular salad dressings. ?Other foods ?Salted popcorn and pretzels. ?The items listed above may not be a complete list of foods and beverages you should avoid. Contact a dietitian for more information. ?Where to find more information ?National Heart, Lung, and Blood Institute: https://wilson-eaton.com/ ?American Heart Association: www.heart.org ?Academy of Nutrition and Dietetics: www.eatright.org ?[place]: www.kidney.org ?Summary ?The DASH eating plan is a healthy eating plan that has been shown to reduce high blood pressure (hypertension). It may also reduce  your risk for type 2 diabetes, heart disease, and stroke. ?When on the DASH eating plan, aim to eat more fresh fruits and vegetables, whole grains, lean proteins, low-fat dairy, and heart-healthy fats. ?With the DASH eating plan, you should limit salt (sodium) intake to 2,300 mg a day. If  you have hypertension, you may need to reduce your sodium intake to 1,500 mg a day. ?Work with your health care provider or dietitian to adjust your eating plan to your individual calorie needs. ?This information

## 2021-06-19 NOTE — Assessment & Plan Note (Signed)
BP Readings from Last 3 Encounters:  ?06/19/21 (!) 152/90  ?05/30/21 (!) 142/88  ?05/24/20 138/90  ? ?Repeat 158/98 after out discussion ?I think it is likely that he does have white coat HTN ? ?Asked him to check BP at home---get arm cuff ?If he gets persistent elevated measurements at home, will start losartan/HCTZ (50/12.5) ?

## 2021-07-17 ENCOUNTER — Ambulatory Visit (INDEPENDENT_AMBULATORY_CARE_PROVIDER_SITE_OTHER): Payer: BC Managed Care – PPO | Admitting: Internal Medicine

## 2021-07-17 ENCOUNTER — Encounter: Payer: Self-pay | Admitting: Internal Medicine

## 2021-07-17 DIAGNOSIS — I10 Essential (primary) hypertension: Secondary | ICD-10-CM

## 2021-07-17 MED ORDER — LOSARTAN POTASSIUM-HCTZ 50-12.5 MG PO TABS: 1.0000 | ORAL_TABLET | Freq: Every day | ORAL | 3 refills | Status: DC

## 2021-07-17 NOTE — Progress Notes (Signed)
Subjective:    Patient ID: Colin Price, male    DOB: [birth year], 56 y.o.   MRN: [id number]  HPI Here for recheck on blood pressure  He has been checking his BP regularly 150-160/100 typically Does sit around before checking  Has tried to improve diet--more salad Not much exercise Ready for medication  Current Outpatient Medications on File Prior to Visit  Medication Sig Dispense Refill   ibuprofen (ADVIL,MOTRIN) 200 MG tablet Take 200 mg by mouth every 6 (six) hours as needed.     Multiple Vitamin (MULTIVITAMIN) tablet Take 1-2 by mouth three times a week.     omeprazole (PRILOSEC) 20 MG capsule Take 20 mg by mouth every other day.     No current facility-administered medications on file prior to visit.    No Known Allergies  Past Medical History:  Diagnosis Date   ED (erectile dysfunction)    GERD (gastroesophageal reflux disease)    Kidney stones    multiple---has passed    Past Surgical History:  Procedure Laterality Date   UROGENITAL SINUS REPAIR  9758   umbilicus sinus infection- repair   WISDOM TOOTH EXTRACTION      Family History  Problem Relation Age of Onset   Healthy Mother    Hypertension Father    Colon polyps Father    Heart disease Maternal Grandfather    Cancer Paternal Grandfather        died of melanoma   Colon cancer Neg Hx    Esophageal cancer Neg Hx    Rectal cancer Neg Hx    Stomach cancer Neg Hx     Social History   Socioeconomic History   Marital status: Single    Spouse name: Not on file   Number of children: 1   Years of education: Not on file   Highest education level: Not on file  Occupational History   Occupation: Dispatcher    Comment: [employer]  Tobacco Use   Smoking status: Former    Packs/day: 0.25    Years: 10.00    Pack years: 2.50    Types: Cigarettes    Quit date: 03/02/2000    Years since quitting: 21.3   Smokeless tobacco: Never  Substance and Sexual Activity   Alcohol use: Yes     Alcohol/week: 3.0 standard drinks    Types: 3 Shots of liquor per week    Comment: occasional   Drug use: No   Sexual activity: Not on file  Other Topics Concern   Not on file  Social History Narrative   Not on file   Social Determinants of Health   Financial Resource Strain: Not on file  Food Insecurity: Not on file  Transportation Needs: Not on file  Physical Activity: Not on file  Stress: Not on file  Social Connections: Not on file  Intimate Partner Violence: Not on file   Review of Systems Sleeping okay    Objective:   Physical Exam Constitutional:      Appearance: Normal appearance.  Cardiovascular:     Rate and Rhythm: Normal rate and regular rhythm.     Heart sounds: No murmur heard.   No gallop.  Pulmonary:     Effort: Pulmonary effort is normal.     Breath sounds: Normal breath sounds. No wheezing or rales.  Musculoskeletal:     Cervical back: Neck supple.     Right lower leg: No edema.     Left lower leg: No edema.  Lymphadenopathy:  Cervical: No cervical adenopathy.  Neurological:     Mental Status: He is alert.           Assessment & Plan:

## 2021-07-17 NOTE — Assessment & Plan Note (Signed)
BP Readings from Last 3 Encounters:  07/17/21 130/90  06/19/21 (!) 152/90  05/30/21 (!) 142/88   Persistently elevated at home He is ready for meds Will start losartan/HCTZ 50/12.5 Recheck 6-8 weeks

## 2021-08-29 ENCOUNTER — Encounter: Payer: Self-pay | Admitting: Internal Medicine

## 2021-08-29 ENCOUNTER — Ambulatory Visit (INDEPENDENT_AMBULATORY_CARE_PROVIDER_SITE_OTHER): Payer: BC Managed Care – PPO | Admitting: Internal Medicine

## 2021-08-29 VITALS — BP 108/78 | HR 88 | Temp 97.5°F | Ht 69.5 in | Wt 185.0 lb

## 2021-08-29 DIAGNOSIS — I10 Essential (primary) hypertension: Secondary | ICD-10-CM | POA: Diagnosis not present

## 2021-08-29 DIAGNOSIS — N529 Male erectile dysfunction, unspecified: Secondary | ICD-10-CM

## 2021-08-29 LAB — RENAL FUNCTION PANEL
Albumin: 4.1 g/dL (ref 3.5–5.2)
BUN: 18 mg/dL (ref 6–23)
CO2: 28 mEq/L (ref 19–32)
Calcium: 9.5 mg/dL (ref 8.4–10.5)
Chloride: 103 mEq/L (ref 96–112)
Creatinine, Ser: 1 mg/dL (ref 0.40–1.50)
GFR: 84.28 mL/min (ref >60.00)
Glucose, Bld: 99 mg/dL (ref 70–99)
Phosphorus: 2.7 mg/dL (ref 2.3–4.6)
Potassium: 4.1 mEq/L (ref 3.5–5.1)
Sodium: 139 mEq/L (ref 135–145)

## 2021-08-29 MED ORDER — SILDENAFIL CITRATE 100 MG PO TABS: 50.0000 mg | ORAL_TABLET | Freq: Every day | ORAL | 11 refills | Status: DC | PRN

## 2021-08-29 NOTE — Assessment & Plan Note (Signed)
BP Readings from Last 3 Encounters:  08/29/21 108/78  07/17/21 130/90  06/19/21 (!) 152/90   I got 126/80 on right His cuff 126/96 on left Some concern his cuff doesn't correlate Has done well with the losartan/HCTZ 50/12.5 Will check renal profile

## 2021-08-29 NOTE — Assessment & Plan Note (Signed)
He is ready to try medication Will Rx sildenafil

## 2021-08-29 NOTE — Progress Notes (Signed)
Subjective:    Patient ID: Colin Price, male    DOB: 07/08/1965, 56 y.o.   MRN: [id number]  HPI Here for follow up of HTN  No problems with the new medication Checking at home---mostly 120's/80's No orthostatic dizziness No cough No SOB  Current Outpatient Medications on File Prior to Visit  Medication Sig Dispense Refill   ibuprofen (ADVIL,MOTRIN) 200 MG tablet Take 200 mg by mouth every 6 (six) hours as needed.     losartan-hydrochlorothiazide (HYZAAR) 50-12.5 MG tablet Take 1 tablet by mouth daily. 90 tablet 3   Multiple Vitamin (MULTIVITAMIN) tablet Take 1-2 by mouth three times a week.     omeprazole (PRILOSEC) 20 MG capsule Take 20 mg by mouth every other day.     No current facility-administered medications on file prior to visit.    No Known Allergies  Past Medical History:  Diagnosis Date   ED (erectile dysfunction)    GERD (gastroesophageal reflux disease)    Kidney stones    multiple---has passed    Past Surgical History:  Procedure Laterality Date   UROGENITAL SINUS REPAIR  8466   umbilicus sinus infection- repair   WISDOM TOOTH EXTRACTION      Family History  Problem Relation Age of Onset   Healthy Mother    Hypertension Father    Colon polyps Father    Heart disease Maternal Grandfather    Cancer Paternal Grandfather        died of melanoma   Colon cancer Neg Hx    Esophageal cancer Neg Hx    Rectal cancer Neg Hx    Stomach cancer Neg Hx     Social History   Socioeconomic History   Marital status: Single    Spouse name: Not on file   Number of children: 1   Years of education: Not on file   Highest education level: Not on file  Occupational History   Occupation: Dispatcher    Comment: [employer]  Tobacco Use   Smoking status: Former    Packs/day: 0.25    Years: 10.00    Total pack years: 2.50    Types: Cigarettes    Quit date: 03/02/2000    Years since quitting: 21.5    Passive exposure: Past   Smokeless tobacco:  Never  Substance and Sexual Activity   Alcohol use: Yes    Alcohol/week: 3.0 standard drinks of alcohol    Types: 3 Shots of liquor per week    Comment: occasional   Drug use: No   Sexual activity: Not on file  Other Topics Concern   Not on file  Social History Narrative   Not on file   Social Determinants of Health   Financial Resource Strain: Not on file  Food Insecurity: Not on file  Transportation Needs: Not on file  Physical Activity: Not on file  Stress: Not on file  Social Connections: Not on file  Intimate Partner Violence: Not on file   Review of Systems Has noticed some ED--wonders about meds for that This had been mild before--but more noticeable     Objective:   Physical Exam Constitutional:      Appearance: Normal appearance.  Cardiovascular:     Rate and Rhythm: Normal rate and regular rhythm.     Heart sounds: No murmur heard.    No gallop.  Pulmonary:     Effort: Pulmonary effort is normal.     Breath sounds: Normal breath sounds. No wheezing or rales.  Musculoskeletal:     Cervical back: Neck supple.     Right lower leg: No edema.     Left lower leg: No edema.  Lymphadenopathy:     Cervical: No cervical adenopathy.  Neurological:     Mental Status: He is alert.            Assessment & Plan:

## 2021-11-06 ENCOUNTER — Telehealth: Payer: Self-pay | Admitting: Internal Medicine

## 2021-11-06 NOTE — Telephone Encounter (Signed)
  Encourage patient to contact the pharmacy for refills or they can request refills through [facility]  Did the patient contact the pharmacy:  N   LAST APPOINTMENT DATE:  6.30.23 NEXT APPOINTMENT DATE: 4.5.24   MEDICATION: sildenafil (VIAGRA) 100 MG tablet  Is the patient out of medication? N  If not, how much is left? Pt declined to say  Is this a 84 day supply:   PHARMACY:  [facility] [id number] - [address], [place] Phone:  [phone]  Fax:  [phone]      Let patient know to contact pharmacy at the end of the day to make sure medication is ready.  Please notify patient to allow 48-72 hours to process

## 2021-11-06 NOTE — Telephone Encounter (Signed)
Pharmacy: [facility] [id number] - [address], [place] [facility] Dose History Change      Summary: Take 0.5-1 tablets (50-100 mg total) by mouth daily as needed for erectile dysfunction., Starting Fri 08/29/2021, Normal     Patient Sig: Take 0.5-1 tablets (50-100 mg total) by mouth daily as needed for erectile dysfunction.     Ordered on: 08/29/2021     Authorized by: Viviana Simpler I     Dispense: 10 tablet     Left message on VM for pt to check with HT.

## 2022-01-26 ENCOUNTER — Telehealth: Payer: Self-pay | Admitting: Internal Medicine

## 2022-01-26 MED ORDER — SILDENAFIL CITRATE 100 MG PO TABS: 50.0000 mg | ORAL_TABLET | Freq: Every day | ORAL | 11 refills | Status: DC | PRN

## 2022-01-26 NOTE — Telephone Encounter (Signed)
Caller Name: tycen Call back phone #: [phone]  MEDICATION(S):  sildenafil (VIAGRA) 100 MG tablet   Days of Med Remaining: 0  Has the patient contacted their pharmacy (YES/NO)? NO What did pharmacy advise?   Preferred Pharmacy:  [facility] [id number] - [address], [place]   ~~~Please advise patient/caregiver to allow 2-3 business days to process RX refills.

## 2022-01-26 NOTE — Telephone Encounter (Signed)
Rx sent electronically.  

## 2022-04-16 ENCOUNTER — Telehealth: Payer: Self-pay | Admitting: Internal Medicine

## 2022-04-16 NOTE — Telephone Encounter (Signed)
Prescription Request  04/16/2022  Is this a "Controlled Substance" medicine? No  LOV: 08/29/2021  What is the name of the medication or equipment?  sildenafil (VIAGRA) 100 MG tablet  Have you contacted your pharmacy to request a refill? No   Which pharmacy would you like this sent to?    [facility] [id number], [place] [address] Phone: [phone] Fax: [phone]    Patient notified that their request is being sent to the clinical staff for review and that they should receive a response within 2 business days.   Please advise at Mobile [phone] (mobile)

## 2022-04-16 NOTE — Telephone Encounter (Signed)
Called and spoke to pt. He should have refills. We did a rx for 1 year in November.

## 2022-06-05 ENCOUNTER — Encounter: Payer: Self-pay | Admitting: Internal Medicine

## 2022-06-05 ENCOUNTER — Ambulatory Visit (INDEPENDENT_AMBULATORY_CARE_PROVIDER_SITE_OTHER): Payer: BC Managed Care – PPO | Admitting: Internal Medicine

## 2022-06-05 VITALS — BP 124/82 | HR 90 | Temp 97.9°F | Ht 70.0 in | Wt 187.2 lb

## 2022-06-05 DIAGNOSIS — Z125 Encounter for screening for malignant neoplasm of prostate: Secondary | ICD-10-CM

## 2022-06-05 DIAGNOSIS — I10 Essential (primary) hypertension: Secondary | ICD-10-CM | POA: Diagnosis not present

## 2022-06-05 DIAGNOSIS — Z0000 Encounter for general adult medical examination without abnormal findings: Secondary | ICD-10-CM | POA: Diagnosis not present

## 2022-06-05 LAB — COMPREHENSIVE METABOLIC PANEL
ALT: 21 U/L (ref 0–53)
AST: 15 U/L (ref 0–37)
Albumin: 4 g/dL (ref 3.5–5.2)
Alkaline Phosphatase: 81 U/L (ref 39–117)
BUN: 17 mg/dL (ref 6–23)
CO2: 26 mEq/L (ref 19–32)
Calcium: 9.4 mg/dL (ref 8.4–10.5)
Chloride: 104 mEq/L (ref 96–112)
Creatinine, Ser: 1.04 mg/dL (ref 0.40–1.50)
GFR: 79.98 mL/min (ref >60.00)
Glucose, Bld: 85 mg/dL (ref 70–99)
Potassium: 4.1 mEq/L (ref 3.5–5.1)
Sodium: 139 mEq/L (ref 135–145)
Total Bilirubin: 0.7 mg/dL (ref 0.2–1.2)
Total Protein: 6.8 g/dL (ref 6.0–8.3)

## 2022-06-05 LAB — LIPID PANEL
Cholesterol: 211 mg/dL — ABNORMAL HIGH (ref 0–200)
HDL: 33.8 mg/dL — ABNORMAL LOW (ref >39.00)
Total CHOL/HDL Ratio: 6
Triglycerides: 522 mg/dL — ABNORMAL HIGH (ref 0.0–149.0)

## 2022-06-05 LAB — CBC
HCT: 41.6 % (ref 39.0–52.0)
Hemoglobin: 14.2 g/dL (ref 13.0–17.0)
MCHC: 34.3 g/dL (ref 30.0–36.0)
MCV: 88.4 fl (ref 78.0–100.0)
Platelets: 268 10*3/uL (ref 150.0–400.0)
RBC: 4.7 Mil/uL (ref 4.22–5.81)
RDW: 13.3 % (ref 11.5–15.5)
WBC: 9.2 10*3/uL (ref 4.0–10.5)

## 2022-06-05 LAB — PSA: PSA: 0.69 ng/mL (ref 0.10–4.00)

## 2022-06-05 LAB — LDL CHOLESTEROL, DIRECT: Direct LDL: 76 mg/dL

## 2022-06-05 MED ORDER — LOSARTAN POTASSIUM-HCTZ 50-12.5 MG PO TABS: 1.0000 | ORAL_TABLET | Freq: Every day | ORAL | 3 refills | Status: DC

## 2022-06-05 NOTE — Progress Notes (Signed)
Subjective:    Patient ID: Colin Price, male    DOB: Aug 09, 1965, 57 y.o.   MRN: [id number]  HPI Here for physical  Doing well Checks blood pressure occasionally---usually okay No regular exercise--"work dominates everything"  Shoulder is some better Still not playing golf  Current Outpatient Medications on File Prior to Visit  Medication Sig Dispense Refill   ibuprofen (ADVIL,MOTRIN) 200 MG tablet Take 200 mg by mouth every 6 (six) hours as needed.     losartan-hydrochlorothiazide (HYZAAR) 50-12.5 MG tablet Take 1 tablet by mouth daily. 90 tablet 3   Multiple Vitamin (MULTIVITAMIN) tablet Take 1-2 by mouth three times a week.     omeprazole (PRILOSEC) 20 MG capsule Take 20 mg by mouth every other day.     sildenafil (VIAGRA) 100 MG tablet Take 0.5-1 tablets (50-100 mg total) by mouth daily as needed for erectile dysfunction. 10 tablet 11   No current facility-administered medications on file prior to visit.    No Known Allergies  Past Medical History:  Diagnosis Date   ED (erectile dysfunction)    GERD (gastroesophageal reflux disease)    Kidney stones    multiple---has passed    Past Surgical History:  Procedure Laterality Date   UROGENITAL SINUS REPAIR  1995   umbilicus sinus infection- repair   WISDOM TOOTH EXTRACTION      Family History  Problem Relation Age of Onset   Healthy Mother    Hypertension Father    Colon polyps Father    Heart disease Maternal Grandfather    Cancer Paternal Grandfather        died of melanoma   Colon cancer Neg Hx    Esophageal cancer Neg Hx    Rectal cancer Neg Hx    Stomach cancer Neg Hx     Social History   Socioeconomic History   Marital status: Single    Spouse name: Not on file   Number of children: 1   Years of education: Not on file   Highest education level: Not on file  Occupational History   Occupation: Dispatcher    Comment: [employer]  Tobacco Use   Smoking status: Former    Packs/day:  0.25    Years: 10.00    Additional pack years: 0.00    Total pack years: 2.50    Types: Cigarettes    Quit date: 03/02/2000    Years since quitting: 22.2    Passive exposure: Past   Smokeless tobacco: Never  Substance and Sexual Activity   Alcohol use: Yes    Alcohol/week: 3.0 standard drinks of alcohol    Types: 3 Shots of liquor per week    Comment: occasional   Drug use: No   Sexual activity: Not on file  Other Topics Concern   Not on file  Social History Narrative   Not on file   Social Determinants of Health   Financial Resource Strain: Not on file  Food Insecurity: Not on file  Transportation Needs: Not on file  Physical Activity: Not on file  Stress: Not on file  Social Connections: Not on file  Intimate Partner Violence: Not on file   Review of Systems  Constitutional:  Negative for fatigue and unexpected weight change.       Wears seat belt  HENT:  Positive for tinnitus. Negative for dental problem, hearing loss and trouble swallowing.        Keeps up with dentist  Eyes:  Negative for visual disturbance.  No diplopia or unilateral vision loss  Respiratory:  Negative for cough, chest tightness and shortness of breath.   Cardiovascular:  Negative for chest pain, palpitations and leg swelling.  Gastrointestinal:  Negative for blood in stool and constipation.       Heartburn controlled with omeprazole every other day  Genitourinary:  Negative for difficulty urinating and urgency.       Satisfied with the viagra  Musculoskeletal:  Negative for back pain and joint swelling.  Skin:  Negative for rash.  Allergic/Immunologic: Negative for environmental allergies and immunocompromised state.  Neurological:  Negative for dizziness, syncope, light-headedness and headaches.  Hematological:  Negative for adenopathy. Does not bruise/bleed easily.  Psychiatric/Behavioral:  Negative for dysphoric mood. The patient is not nervous/anxious.        Usually only gets 5-6 hours  of sleep--then up No sig daytime somnolence       Objective:   Physical Exam Constitutional:      Appearance: Normal appearance.  HENT:     Mouth/Throat:     Pharynx: No oropharyngeal exudate or posterior oropharyngeal erythema.  Eyes:     Conjunctiva/sclera: Conjunctivae normal.     Pupils: Pupils are equal, round, and reactive to light.  Cardiovascular:     Rate and Rhythm: Normal rate and regular rhythm.     Pulses: Normal pulses.     Heart sounds:     No gallop.  Pulmonary:     Effort: Pulmonary effort is normal.     Breath sounds: Normal breath sounds. No wheezing or rales.  Abdominal:     Palpations: Abdomen is soft.     Tenderness: There is no abdominal tenderness.  Musculoskeletal:     Cervical back: Neck supple.     Right lower leg: No edema.     Left lower leg: No edema.  Lymphadenopathy:     Cervical: No cervical adenopathy.  Skin:    Findings: No lesion or rash.  Neurological:     General: No focal deficit present.     Mental Status: He is alert and oriented to person, place, and time.  Psychiatric:        Mood and Affect: Mood normal.        Behavior: Behavior normal.            Assessment & Plan:

## 2022-06-05 NOTE — Assessment & Plan Note (Signed)
Healthy Discussed finding time for exercise Flu vaccine in the fall Prefers no COVID vaccine Colon due 2027 Check PSA today

## 2022-06-05 NOTE — Assessment & Plan Note (Signed)
BP Readings from Last 3 Encounters:  06/05/22 124/82  08/29/21 108/78  07/17/21 130/90   Good control on the losartan/HCTZ 50/12.5

## 2023-05-28 ENCOUNTER — Other Ambulatory Visit: Payer: Self-pay | Admitting: Internal Medicine

## 2023-06-11 ENCOUNTER — Ambulatory Visit: Payer: BC Managed Care – PPO | Admitting: Internal Medicine

## 2023-06-11 ENCOUNTER — Encounter: Payer: Self-pay | Admitting: Internal Medicine

## 2023-06-11 VITALS — BP 118/76 | HR 83 | Temp 98.3°F | Ht 69.5 in | Wt 183.0 lb

## 2023-06-11 DIAGNOSIS — I10 Essential (primary) hypertension: Secondary | ICD-10-CM | POA: Diagnosis not present

## 2023-06-11 DIAGNOSIS — K219 Gastro-esophageal reflux disease without esophagitis: Secondary | ICD-10-CM

## 2023-06-11 DIAGNOSIS — Z125 Encounter for screening for malignant neoplasm of prostate: Secondary | ICD-10-CM

## 2023-06-11 DIAGNOSIS — Z0000 Encounter for general adult medical examination without abnormal findings: Secondary | ICD-10-CM | POA: Diagnosis not present

## 2023-06-11 DIAGNOSIS — E785 Hyperlipidemia, unspecified: Secondary | ICD-10-CM

## 2023-06-11 LAB — COMPREHENSIVE METABOLIC PANEL WITH GFR
ALT: 25 U/L (ref 0–53)
AST: 17 U/L (ref 0–37)
Albumin: 4.2 g/dL (ref 3.5–5.2)
Alkaline Phosphatase: 90 U/L (ref 39–117)
BUN: 18 mg/dL (ref 6–23)
CO2: 31 meq/L (ref 19–32)
Calcium: 9.8 mg/dL (ref 8.4–10.5)
Chloride: 101 meq/L (ref 96–112)
Creatinine, Ser: 0.91 mg/dL (ref 0.40–1.50)
GFR: 93.21 mL/min (ref >60.00)
Glucose, Bld: 100 mg/dL — ABNORMAL HIGH (ref 70–99)
Potassium: 4 meq/L (ref 3.5–5.1)
Sodium: 139 meq/L (ref 135–145)
Total Bilirubin: 0.8 mg/dL (ref 0.2–1.2)
Total Protein: 7.1 g/dL (ref 6.0–8.3)

## 2023-06-11 LAB — LIPID PANEL
Cholesterol: 190 mg/dL (ref 0–200)
HDL: 32.5 mg/dL — ABNORMAL LOW (ref >39.00)
NonHDL: 157.52
Total CHOL/HDL Ratio: 6
Triglycerides: 474 mg/dL — ABNORMAL HIGH (ref 0.0–149.0)
VLDL: 94.8 mg/dL — ABNORMAL HIGH (ref 0.0–40.0)

## 2023-06-11 LAB — LDL CHOLESTEROL, DIRECT: Direct LDL: 76 mg/dL

## 2023-06-11 LAB — CBC
HCT: 42.6 % (ref 39.0–52.0)
Hemoglobin: 14.6 g/dL (ref 13.0–17.0)
MCHC: 34.3 g/dL (ref 30.0–36.0)
MCV: 88.6 fl (ref 78.0–100.0)
Platelets: 292 10*3/uL (ref 150.0–400.0)
RBC: 4.8 Mil/uL (ref 4.22–5.81)
RDW: 13.3 % (ref 11.5–15.5)
WBC: 9 10*3/uL (ref 4.0–10.5)

## 2023-06-11 LAB — PSA: PSA: 0.96 ng/mL (ref 0.10–4.00)

## 2023-06-11 MED ORDER — LOSARTAN POTASSIUM-HCTZ 50-12.5 MG PO TABS: 1.0000 | ORAL_TABLET | Freq: Every day | ORAL | 3 refills | Status: AC

## 2023-06-11 NOTE — Assessment & Plan Note (Signed)
 Healthy Discussed working on exercise, etc Prefers no COVID vaccine--but yearly flu vaccine Colon due 2027 Will check PSA

## 2023-06-11 NOTE — Progress Notes (Signed)
 Subjective:    Patient ID: Colin Price, male    DOB: May 22, 1965, 58 y.o.   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     losartan-hydrochlorothiazide (HYZAAR) 50-12.5 MG tablet Take 1 tablet by mouth daily. 90 tablet 3   Multiple Vitamin (MULTIVITAMIN) tablet Take 1-2 by mouth three times a week.     omeprazole (PRILOSEC) 20 MG capsule Take 20 mg by mouth every other day.     sildenafil (VIAGRA) 100 MG tablet TAKE 1/2 TO 1 TABLET BY MOUTH DAILY AS NEEDED FOR ERECTILE DYSFUNCTION 10 tablet 0   No current facility-administered medications on file prior to visit.    No Known Allergies  Past Medical History:  Diagnosis Date   ED (erectile dysfunction)    GERD (gastroesophageal reflux disease)    Kidney stones    multiple---has passed    Past Surgical History:  Procedure Laterality Date   UROGENITAL SINUS REPAIR  1995   umbilicus sinus infection- repair   WISDOM TOOTH EXTRACTION      Family History  Problem Relation Age of Onset   Healthy Mother    Hypertension Father    Colon polyps Father    Heart disease Maternal Grandfather    Cancer Paternal Grandfather        died of melanoma   Colon cancer Neg Hx    Esophageal cancer Neg Hx    Rectal cancer Neg Hx    Stomach cancer Neg Hx     Social History   Socioeconomic History   Marital status: Single    Spouse name: Not on file   Number of children: 1   Years of education: Not on file   Highest education level: Not on file  Occupational History   Occupation: Dispatcher    Comment: [employer]  Tobacco Use   Smoking status: Former    Current packs/day:  0.00    Average packs/day: 0.3 packs/day for 10.0 years (2.5 ttl pk-yrs)    Types: Cigarettes    Start date: 03/02/1990    Quit date: 03/02/2000    Years since quitting: 23.2    Passive exposure: Past   Smokeless tobacco: Never  Substance and Sexual Activity   Alcohol use: Yes    Alcohol/week: 3.0 standard drinks of alcohol    Types: 3 Shots of liquor per week    Comment: occasional   Drug use: No   Sexual activity: Not on file  Other Topics Concern   Not on file  Social History Narrative   Not on file   Social Drivers of Health   Financial Resource Strain: Not on file  Food Insecurity: Not on file  Transportation Needs: Not on file  Physical Activity: Not on file  Stress: Not on file  Social Connections: Unknown (07/14/2021)   Received from [facility]   Social Network    Social Network: Not on file  Intimate Partner Violence: Unknown (06/03/2021)   Received from [facility]   HITS    Physically Hurt: Not on file    Insult or Talk Down To: Not on file    Threaten Physical Harm:  Not on file    Scream or Curse: Not on file   Review of Systems  Constitutional:  Negative for fatigue.       Weight down a few pounds Wears seat belt  HENT:  Positive for tinnitus. Negative for dental problem, hearing loss and trouble swallowing.        Keeps up dentist  Eyes:  Negative for visual disturbance.       No diplopia or unilateral vision loss  Respiratory:  Negative for cough, chest tightness and shortness of breath.   Cardiovascular:  Negative for chest pain, palpitations and leg swelling.  Gastrointestinal:  Negative for blood in stool and constipation.       Uses omeprazole several times a week--controls heartburn  Endocrine: Negative for polydipsia and polyuria.  Genitourinary:  Negative for difficulty urinating, frequency and urgency.       Satisfied with sildenafil  Musculoskeletal:  Negative for arthralgias, back pain and joint swelling.   Skin:  Negative for rash.       No suspicious lesions  Allergic/Immunologic: Positive for environmental allergies. Negative for immunocompromised state.       Spring symptoms--will use OTC meds rarely  Neurological:  Negative for dizziness, syncope, light-headedness and headaches.  Hematological:  Negative for adenopathy. Does not bruise/bleed easily.  Psychiatric/Behavioral:  Negative for dysphoric mood and sleep disturbance. The patient is not nervous/anxious.        Objective:   Physical Exam Constitutional:      Appearance: Normal appearance.  HENT:     Mouth/Throat:     Pharynx: No oropharyngeal exudate or posterior oropharyngeal erythema.  Eyes:     Conjunctiva/sclera: Conjunctivae normal.     Pupils: Pupils are equal, round, and reactive to light.  Cardiovascular:     Rate and Rhythm: Normal rate and regular rhythm.     Pulses: Normal pulses.     Heart sounds: No murmur heard.    No gallop.  Pulmonary:     Effort: Pulmonary effort is normal.     Breath sounds: Normal breath sounds. No wheezing or rales.  Abdominal:     Palpations: Abdomen is soft.     Tenderness: There is no abdominal tenderness.  Musculoskeletal:     Cervical back: Neck supple.     Right lower leg: No edema.     Left lower leg: No edema.  Lymphadenopathy:     Cervical: No cervical adenopathy.  Skin:    Findings: No lesion or rash.  Neurological:     General: No focal deficit present.     Mental Status: He is alert and oriented to person, place, and time.  Psychiatric:        Mood and Affect: Mood normal.        Behavior: Behavior normal.            Assessment & Plan:

## 2023-06-11 NOTE — Assessment & Plan Note (Signed)
 BP Readings from Last 3 Encounters:  06/11/23 118/76  06/05/22 124/82  08/29/21 108/78   Controlled on losartan/hydrochlorothiazide 50/12.5 Will check labs

## 2023-06-11 NOTE — Assessment & Plan Note (Signed)
 Mild elevation Discussed statins--will hold off on primary prevention for now

## 2023-06-11 NOTE — Assessment & Plan Note (Signed)
 Quiet on omeprazole several times a week

## 2023-08-11 ENCOUNTER — Other Ambulatory Visit: Payer: Self-pay | Admitting: Internal Medicine

## 2024-06-16 ENCOUNTER — Encounter
